# Patient Record
Sex: Female | Born: 1995 | Race: Black or African American | Hispanic: No | Marital: Single | Smoking: Former smoker
Health system: Southern US, Community
[De-identification: ages and names within clinical notes are randomized; demographics above are authoritative.]

## PROBLEM LIST (undated history)

## (undated) DIAGNOSIS — N739 Female pelvic inflammatory disease, unspecified: Secondary | ICD-10-CM

## (undated) DIAGNOSIS — N390 Urinary tract infection, site not specified: Secondary | ICD-10-CM

## (undated) HISTORY — PX: NO PAST SURGERIES: SHX2092

## (undated) HISTORY — DX: Urinary tract infection, site not specified: N39.0

---
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
## 2014-09-04 ENCOUNTER — Encounter (HOSPITAL_COMMUNITY): Payer: Self-pay | Admitting: Emergency Medicine

## 2014-09-04 ENCOUNTER — Emergency Department (INDEPENDENT_AMBULATORY_CARE_PROVIDER_SITE_OTHER)
Admission: EM | Admit: 2014-09-04 | Discharge: 2014-09-04 | Disposition: A | Discharge status: Home / Self Care | Payer: Medicaid Other | Source: Home / Self Care

## 2014-09-04 DIAGNOSIS — J029 Acute pharyngitis, unspecified: Secondary | ICD-10-CM

## 2014-09-04 LAB — POCT RAPID STREP A: Streptococcus, Group A Screen (Direct): NEGATIVE

## 2014-09-04 LAB — POCT INFECTIOUS MONO SCREEN: MONO SCREEN: NEGATIVE

## 2014-09-04 MED ORDER — CLINDAMYCIN HCL 300 MG PO CAPS: 300.0000 mg | ORAL_CAPSULE | Freq: Three times a day (TID) | ORAL | Status: DC

## 2014-09-04 NOTE — ED Provider Notes (Signed)
CSN: [id number]     Arrival date & time 09/04/14  1733 History   First MD Initiated Contact with Patient 09/04/14 1751     Chief Complaint  Patient presents with  . Sore Throat   (Consider location/radiation/quality/duration/timing/severity/associated sxs/prior Treatment) HPI Comments: 18 year old female states that she developed a minor sore throat approximately 10 days ago. 4 days ago the throat pain became worse and developed a fever over 103. She saw a physician and was diagnosed with strep throat, although no testing for strep was performed. She was administered penicillin IM and a prescription for amoxicillin. Since the treatment she continues to have a sore throat and fever about 103 at home. She feels as though she is not getting any better and she continues to sore throat pain. Is also noted that her flu test was negative.   History reviewed. No pertinent past medical history. History reviewed. No pertinent past surgical history. History reviewed. No pertinent family history. History  Substance Use Topics  . Smoking status: Never Smoker   . Smokeless tobacco: Not on file  . Alcohol Use: No   OB History   Grav Para Term Preterm Abortions TAB SAB Ect Mult Living                 Review of Systems  Constitutional: Positive for fever, activity change and fatigue.  HENT: Positive for sore throat. Negative for congestion, ear pain, hearing loss, mouth sores, postnasal drip, rhinorrhea and trouble swallowing.   Respiratory: Negative for cough, choking, shortness of breath, wheezing and stridor.   Cardiovascular: Negative for chest pain.  Gastrointestinal: Negative.   Musculoskeletal: Negative.   Skin: Negative.     Allergies  Review of patient's allergies indicates no known allergies.  Home Medications   Prior to Admission medications   Medication Sig Start Date End Date Taking? Authorizing Provider  amoxicillin (AMOXIL) 500 MG capsule Take 1,000 mg by mouth daily.   Yes  Historical Provider, MD  ibuprofen (ADVIL,MOTRIN) 200 MG tablet Take 800 mg by mouth every 6 (six) hours as needed for fever or moderate pain.    Yes Historical Provider, MD  clindamycin (CLEOCIN) 300 MG capsule Take 1 capsule (300 mg total) by mouth 3 (three) times daily. 09/04/14   Hayden Rasmussenavid Jeryn Bertoni, NP   BP 106/66  Pulse 119  Temp(Src) 100 F (37.8 C) (Oral)  Resp 20  SpO2 99%  LMP 08/15/2014 Physical Exam  Nursing note and vitals reviewed. Constitutional: She is oriented to person, place, and time. She appears well-developed and well-nourished. No distress.  HENT:  Bilateral TMs are normal Oropharynx with enlarged, cryptic erythematous palatine tonsils with exudates. No retropharyngeal abscess is appreciated. Airway is widely patent.  Eyes: Conjunctivae and EOM are normal.  Neck: Normal range of motion. Neck supple.  Multiple, enlarged bilateral anterior cervical chain lymphadenitis.  Cardiovascular: Regular rhythm and normal heart sounds.   Mild tachycardia  Pulmonary/Chest: Effort normal and breath sounds normal. No respiratory distress. She has no wheezes. She has no rales.  Musculoskeletal: She exhibits no edema.  Lymphadenopathy:    She has cervical adenopathy.  Neurological: She is alert and oriented to person, place, and time. She exhibits normal muscle tone.  Skin: Skin is warm and dry. No erythema.  Psychiatric: She has a normal mood and affect.    ED Course  Procedures (including critical care time) Labs Review Labs Reviewed  MONONUCLEOSIS SCREEN  POCT RAPID STREP A ([facility]ARE ONLY)  POCT INFECTIOUS MONO SCREEN  Results for orders placed during the hospital encounter of 09/04/14  POCT RAPID STREP A ([facility] ONLY)      Result Value Ref Range   Streptococcus, Group A Screen (Direct) NEGATIVE  NEGATIVE  POCT INFECTIOUS MONO SCREEN      Result Value Ref Range   Mono Screen NEGATIVE  NEGATIVE    Imaging Review No results found.   MDM   1. Exudative  pharyngitis     Likely viral. Will consider other bacterial organisms and tx with clindamycin Other is possible mono with delayed testing response. Ibuprofen 400 to 600 mg every 6 to 8 hours Lots of fuids See your PCP in 3 days     Hayden Rasmussenavid Brynnan Rodenbaugh, NP 09/04/14 1918

## 2014-09-04 NOTE — ED Notes (Signed)
States she had strep throat last Fri.  It got worse this Fri.  She got a shot of PCN on 10/23.  Got worse last night.  Temp was 103 at 1600.  She took Ibuprofen 800 mg @ 1530.  C/o pain in the back of her neck. States her throat is swollen.

## 2014-09-04 NOTE — ED Provider Notes (Signed)
Medical screening examination/treatment/procedure(s) were performed by resident physician or non-physician practitioner and as supervising physician I was immediately available for consultation/collaboration.   Cienna Dumais DOUGLAS MD.   Leondre Taul D Christ Fullenwider, MD 09/04/14 1934 

## 2014-09-04 NOTE — Discharge Instructions (Signed)
Pharyngitis Ibuprofen 400 to 600 mg every 6 to 8 hours Lots of fuids  Pharyngitis is redness, pain, and swelling (inflammation) of your pharynx.  CAUSES  Pharyngitis is usually caused by infection. Most of the time, these infections are from viruses (viral) and are part of a cold. However, sometimes pharyngitis is caused by bacteria (bacterial). Pharyngitis can also be caused by allergies. Viral pharyngitis may be spread from person to person by coughing, sneezing, and personal items or utensils (cups, forks, spoons, toothbrushes). Bacterial pharyngitis may be spread from person to person by more intimate contact, such as kissing.  SIGNS AND SYMPTOMS  Symptoms of pharyngitis include:   Sore throat.   Tiredness (fatigue).   Low-grade fever.   Headache.  Joint pain and muscle aches.  Skin rashes.  Swollen lymph nodes.  Plaque-like film on throat or tonsils (often seen with bacterial pharyngitis). DIAGNOSIS  Your health care provider will ask you questions about your illness and your symptoms. Your medical history, along with a physical exam, is often all that is needed to diagnose pharyngitis. Sometimes, a rapid strep test is done. Other lab tests may also be done, depending on the suspected cause.  TREATMENT  Viral pharyngitis will usually get better in 3-4 days without the use of medicine. Bacterial pharyngitis is treated with medicines that kill germs (antibiotics).  HOME CARE INSTRUCTIONS   Drink enough water and fluids to keep your urine clear or pale yellow.   Only take over-the-counter or prescription medicines as directed by your health care provider:   If you are prescribed antibiotics, make sure you finish them even if you start to feel better.   Do not take aspirin.   Get lots of rest.   Gargle with 8 oz of salt water ( tsp of salt per 1 qt of water) as often as every 1-2 hours to soothe your throat.   Throat lozenges (if you are not at risk for choking)  or sprays may be used to soothe your throat. SEEK MEDICAL CARE IF:   You have large, tender lumps in your neck.  You have a rash.  You cough up green, yellow-brown, or bloody spit. SEEK IMMEDIATE MEDICAL CARE IF:   Your neck becomes stiff.  You drool or are unable to swallow liquids.  You vomit or are unable to keep medicines or liquids down.  You have severe pain that does not go away with the use of recommended medicines.  You have trouble breathing (not caused by a stuffy nose). MAKE SURE YOU:   Understand these instructions.  Will watch your condition.  Will get help right away if you are not doing well or get worse. Document Released: 10/26/2005 Document Revised: 08/16/2013 Document Reviewed: 07/03/2013 [facility] Patient Information 2015 [place], [place]. This information is not intended to replace advice given to you by your health care provider. Make sure you discuss any questions you have with your health care provider.  Salt Water Gargle This solution will help make your mouth and throat feel better. HOME CARE INSTRUCTIONS   Mix 1 teaspoon of salt in 8 ounces of warm water.  Gargle with this solution as much or often as you need or as directed. Swish and gargle gently if you have any sores or wounds in your mouth.  Do not swallow this mixture. Document Released: 07/30/2004 Document Revised: 01/18/2012 Document Reviewed: 12/21/2008 [facility] Patient Information 2015 [place], [place]. This information is not intended to replace advice given to you by your health care  provider. Make sure you discuss any questions you have with your health care provider. ° °

## 2014-09-06 LAB — CULTURE, GROUP A STREP

## 2015-02-24 ENCOUNTER — Emergency Department (HOSPITAL_COMMUNITY)
Admission: EM | Admit: 2015-02-24 | Discharge: 2015-02-24 | Disposition: A | Discharge status: Home / Self Care | Payer: Medicaid Other | Attending: Emergency Medicine | Admitting: Emergency Medicine

## 2015-02-24 ENCOUNTER — Encounter (HOSPITAL_COMMUNITY): Payer: Self-pay | Admitting: Emergency Medicine

## 2015-02-24 DIAGNOSIS — N739 Female pelvic inflammatory disease, unspecified: Secondary | ICD-10-CM | POA: Diagnosis not present

## 2015-02-24 DIAGNOSIS — Z792 Long term (current) use of antibiotics: Secondary | ICD-10-CM | POA: Insufficient documentation

## 2015-02-24 DIAGNOSIS — N898 Other specified noninflammatory disorders of vagina: Secondary | ICD-10-CM | POA: Diagnosis present

## 2015-02-24 DIAGNOSIS — Z3202 Encounter for pregnancy test, result negative: Secondary | ICD-10-CM | POA: Insufficient documentation

## 2015-02-24 DIAGNOSIS — N730 Acute parametritis and pelvic cellulitis: Secondary | ICD-10-CM

## 2015-02-24 LAB — URINALYSIS, ROUTINE W REFLEX MICROSCOPIC
Glucose, UA: NEGATIVE mg/dL
Hgb urine dipstick: NEGATIVE
Ketones, ur: 15 mg/dL — AB
Nitrite: NEGATIVE
PH: 5.5 (ref 5.0–8.0)
Protein, ur: 30 mg/dL — AB
Specific Gravity, Urine: 1.035 — ABNORMAL HIGH (ref 1.005–1.030)
UROBILINOGEN UA: 1 mg/dL (ref 0.0–1.0)

## 2015-02-24 LAB — WET PREP, GENITAL
TRICH WET PREP: NONE SEEN
Yeast Wet Prep HPF POC: NONE SEEN

## 2015-02-24 LAB — URINE MICROSCOPIC-ADD ON

## 2015-02-24 LAB — COMPREHENSIVE METABOLIC PANEL
ALBUMIN: 4.3 g/dL (ref 3.5–5.2)
ALT: 11 U/L (ref 0–35)
ANION GAP: 11 (ref 5–15)
AST: 19 U/L (ref 0–37)
Alkaline Phosphatase: 76 U/L (ref 39–117)
BILIRUBIN TOTAL: 0.9 mg/dL (ref 0.3–1.2)
BUN: 9 mg/dL (ref 6–23)
CALCIUM: 9.6 mg/dL (ref 8.4–10.5)
CO2: 24 mmol/L (ref 19–32)
Chloride: 103 mmol/L (ref 96–112)
Creatinine, Ser: 0.81 mg/dL (ref 0.50–1.10)
Glucose, Bld: 96 mg/dL (ref 70–99)
POTASSIUM: 3.5 mmol/L (ref 3.5–5.1)
SODIUM: 138 mmol/L (ref 135–145)
Total Protein: 7.4 g/dL (ref 6.0–8.3)

## 2015-02-24 LAB — CBC WITH DIFFERENTIAL/PLATELET
BASOS PCT: 0 % (ref 0–1)
Basophils Absolute: 0 10*3/uL (ref 0.0–0.1)
EOS PCT: 1 % (ref 0–5)
Eosinophils Absolute: 0.1 10*3/uL (ref 0.0–0.7)
HEMATOCRIT: 35.8 % — AB (ref 36.0–46.0)
HEMOGLOBIN: 12.4 g/dL (ref 12.0–15.0)
LYMPHS PCT: 27 % (ref 12–46)
Lymphs Abs: 3.2 10*3/uL (ref 0.7–4.0)
MCH: 27.9 pg (ref 26.0–34.0)
MCHC: 34.6 g/dL (ref 30.0–36.0)
MCV: 80.4 fL (ref 78.0–100.0)
Monocytes Absolute: 0.6 10*3/uL (ref 0.1–1.0)
Monocytes Relative: 5 % (ref 3–12)
NEUTROS ABS: 7.9 10*3/uL — AB (ref 1.7–7.7)
NEUTROS PCT: 67 % (ref 43–77)
PLATELETS: 289 10*3/uL (ref 150–400)
RBC: 4.45 MIL/uL (ref 3.87–5.11)
RDW: 14.5 % (ref 11.5–15.5)
WBC: 11.9 10*3/uL — AB (ref 4.0–10.5)

## 2015-02-24 LAB — POC URINE PREG, ED: Preg Test, Ur: NEGATIVE

## 2015-02-24 MED ORDER — IBUPROFEN 400 MG PO TABS: 400.0000 mg | ORAL_TABLET | Freq: Four times a day (QID) | ORAL | Status: DC | PRN

## 2015-02-24 MED ORDER — MORPHINE SULFATE 4 MG/ML IJ SOLN
4.0000 mg | Freq: Once | INTRAMUSCULAR | Status: DC
Start: 2015-02-24 — End: 2015-02-24
  Filled 2015-02-24: qty 1

## 2015-02-24 MED ORDER — AZITHROMYCIN 250 MG PO TABS
1000.0000 mg | ORAL_TABLET | Freq: Once | ORAL | Status: AC
  Administered 2015-02-24: 1000 mg via ORAL
  Filled 2015-02-24: qty 4

## 2015-02-24 MED ORDER — DEXTROSE 5 % IV SOLN
250.0000 mg | Freq: Once | INTRAVENOUS | Status: AC
  Administered 2015-02-24: 250 mg via INTRAVENOUS
  Filled 2015-02-24: qty 250

## 2015-02-24 MED ORDER — DOXYCYCLINE HYCLATE 100 MG PO CAPS: 100.0000 mg | ORAL_CAPSULE | Freq: Two times a day (BID) | ORAL | Status: DC

## 2015-02-24 NOTE — ED Notes (Signed)
Pt. Left with all belongings and refused wheelchair 

## 2015-02-24 NOTE — ED Notes (Signed)
Pt placed in a gown and hooked up to the monitor with the BP cuff and pulse ox 

## 2015-02-24 NOTE — ED Provider Notes (Signed)
CSN: [id number]     Arrival date & time 02/24/15  0006 History  This chart was scribed for Maria Ware Mikah Rottinghaus, MD by Freida Busmaniana Omoyeni, ED Scribe. This patient was seen in room B16C/B16C and the patient's care was started 3:11 AM.    Chief Complaint  Patient presents with  . Abdominal Pain  . Vaginal Ware    The history is provided by the patient. No language interpreter was used.     HPI Comments:  Maria Ware is a 19 y.o. female who presents to the Emergency Department complaining of  cramping lower abdominal pain for ~5 days. She reports associated back pain and abnormal vaginal Ware that started out reddish in color. She deneis hematuria, dysuria, vomiting, fever and chills. She also denies h/o STDs and unprotected sexual intercourse.  No alleviating factors noted. LNMP was ~ 02/06/15.   History reviewed. No pertinent past medical history. History reviewed. No pertinent past surgical history. No family history on file. History  Substance Use Topics  . Smoking status: Never Smoker   . Smokeless tobacco: Not on file  . Alcohol Use: No   OB History    No data available     Review of Systems  Constitutional: Negative for fever and chills.  Gastrointestinal: Positive for abdominal pain. Negative for vomiting.  Genitourinary: Positive for vaginal Ware. Negative for dysuria and hematuria.      Allergies  Review of patient's allergies indicates no known allergies.  Home Medications   Prior to Admission medications   Medication Sig Start Date End Date Taking? Authorizing Provider  amoxicillin (AMOXIL) 500 MG capsule Take 1,000 mg by mouth daily.    Historical Provider, MD  clindamycin (CLEOCIN) 300 MG capsule Take 1 capsule (300 mg total) by mouth 3 (three) times daily. Patient not taking: Reported on 02/24/2015 09/04/14   Hayden Rasmussenavid Mabe, NP  doxycycline (VIBRAMYCIN) 100 MG capsule Take 1 capsule (100 mg total) by mouth 2 (two) times daily. 02/24/15   Maria Ware  Maleeyah Mccaughey, MD  ibuprofen (ADVIL,MOTRIN) 400 MG tablet Take 1 tablet (400 mg total) by mouth every 6 (six) hours as needed. 02/24/15   Alicia Ackert Rhunette CroftNanavati, MD   BP 96/61 mmHg  Pulse 71  Temp(Src) 97.9 F (36.6 C) (Oral)  Resp 16  SpO2 100%  LMP 02/07/2015 Physical Exam  Constitutional: She is oriented to person, place, and time. She appears well-developed and well-nourished. No distress.  HENT:  Head: Normocephalic and atraumatic.  Eyes: Conjunctivae and EOM are normal. Pupils are equal, round, and reactive to light.  Neck: Normal range of motion. Neck supple.  Cardiovascular: Normal rate, regular rhythm, normal heart sounds and intact distal pulses.   No murmur heard. Pulmonary/Chest: Effort normal. No respiratory distress. She has no wheezes.  Abdominal: Soft. Bowel sounds are normal. She exhibits no distension. There is tenderness (Diffuse lower quardrant tenderness). There is guarding (Voluntary guarding ). There is no rebound.  Genitourinary: Vagina normal and uterus normal.  External exam - normal, no lesions Speculum exam: Pt has some Maria Ware, COPIUS, no blood Bimanual exam: Patient has + CMT, no adnexal tenderness or fullness and cervical os is closed  Musculoskeletal: Normal range of motion.  Neurological: She is alert and oriented to person, place, and time.  Skin: Skin is warm and dry.  Nursing note and vitals reviewed.   ED Course  Procedures   DIAGNOSTIC STUDIES:  Oxygen Saturation is 100% on RA, normal by my interpretation.    COORDINATION OF CARE:  3:16 AM Partial  results given. Will order pain meds. Discussed treatment plan with pt at bedside and pt agreed to plan.  Labs Review Labs Reviewed  WET PREP, GENITAL - Abnormal; Notable for the following:    Clue Cells Wet Prep HPF POC FEW (*)    WBC, Wet Prep HPF POC TOO NUMEROUS TO COUNT (*)    All other components within normal limits  CBC WITH DIFFERENTIAL/PLATELET - Abnormal; Notable for the following:     WBC 11.9 (*)    HCT 35.8 (*)    Neutro Abs 7.9 (*)    All other components within normal limits  URINALYSIS, ROUTINE W REFLEX MICROSCOPIC - Abnormal; Notable for the following:    APPearance TURBID (*)    Specific Gravity, Urine 1.035 (*)    Bilirubin Urine SMALL (*)    Ketones, ur 15 (*)    Protein, ur 30 (*)    Leukocytes, UA LARGE (*)    All other components within normal limits  URINE MICROSCOPIC-ADD ON - Abnormal; Notable for the following:    Squamous Epithelial / LPF MANY (*)    Bacteria, UA MANY (*)    All other components within normal limits  COMPREHENSIVE METABOLIC PANEL  POC URINE PREG, ED  GC/CHLAMYDIA PROBE AMP ([place])    Imaging Review No results found.   EKG Interpretation None      MDM   Final diagnoses:  PID (acute pelvic inflammatory disease)    I personally performed the services described in this documentation, which was scribed in my presence. The recorded information has been reviewed and is accurate.  Pt comes in with cc of abdominal pain. She has vaginal Ware, lower quadrant pain. On exam, she has purulent Ware and CMT. Pt admits to unprotected intercourse now. Will tx for PID. Strict return precautions discussed.    Maria Kaplan, MD 02/24/15 [phone]

## 2015-02-24 NOTE — Discharge Instructions (Signed)
Based on the exam - lower pain, with pus like discharge, i am concerned that you have a severe infection in your pelvis. We treated you for gonorrhea and chlamydia infection while you were in the ER. Rest of the results show: There is no evidence of Urinary tract infection and the pregnancy test is negative as well.  PLEASE PRACTICE SAFE SEX. PLEASE INFORM YOUR PARTNER TO GET CHECKED AND TREATED FOR STD AS WELL.   Pelvic Inflammatory Disease Pelvic inflammatory disease (PID) refers to an infection in some or all of the female organs. The infection can be in the uterus, ovaries, fallopian tubes, or the surrounding tissues in the pelvis. PID can cause abdominal or pelvic pain that comes on suddenly (acute pelvic pain). PID is a serious infection because it can lead to lasting (chronic) pelvic pain or the inability to have children (infertile).  CAUSES  The infection is often caused by the normal bacteria found in the vaginal tissues. PID may also be caused by an infection that is spread during sexual contact. PID can also occur following:   The birth of a baby.   A miscarriage.   An abortion.   Major pelvic surgery.   The use of an intrauterine device (IUD).   A sexual assault.  RISK FACTORS Certain factors can put a person at higher risk for PID, such as:  Being younger than 25 years.  Being sexually active at [place] age.  Usingnonbarrier contraception.  Havingmultiple sexual partners.  Having sex with someone who has symptoms of a genital infection.  Using oral contraception. Other times, certain behaviors can increase the possibility of getting PID, such as:  Having sex during your period.  Using a vaginal douche.  Having an intrauterine device (IUD) in place. SYMPTOMS   Abdominal or pelvic pain.   Fever.   Chills.   Abnormal vaginal discharge.  Abnormal uterine bleeding.   Unusual pain shortly after finishing your period. DIAGNOSIS  Your  caregiver will choose some of the following methods to make a diagnosis, such as:   Performinga physical exam and history. A pelvic exam typically reveals a very tender uterus and surrounding pelvis.   Ordering laboratory tests including a pregnancy test, blood tests, and urine test.  Orderingcultures of the vagina and cervix to check for a sexually transmitted infection (STI).  Performing an ultrasound.   Performing a laparoscopic procedure to look inside the pelvis.  TREATMENT   Antibiotic medicines may be prescribed and taken by mouth.   Sexual partners may be treated when the infection is caused by a sexually transmitted disease (STD).   Hospitalization may be needed to give antibiotics intravenously.  Surgery may be needed, but this is rare. It may take weeks until you are completely well. If you are diagnosed with PID, you should also be checked for human immunodeficiency virus (HIV). HOME CARE INSTRUCTIONS   If given, take your antibiotics as directed. Finish the medicine even if you start to feel better.   Only take over-the-counter or prescription medicines for pain, discomfort, or fever as directed by your caregiver.   Do not have sexual intercourse until treatment is completed or as directed by your caregiver. If PID is confirmed, your recent sexual partner(s) will need treatment.   Keep your follow-up appointments. SEEK MEDICAL CARE IF:   You have increased or abnormal vaginal discharge.   You need prescription medicine for your pain.   You vomit.   You cannot take your medicines.   Your  partner has an STD.  SEEK IMMEDIATE MEDICAL CARE IF:   You have a fever.   You have increased abdominal or pelvic pain.   You have chills.   You have pain when you urinate.   You are not better after 72 hours following treatment.  MAKE SURE YOU:   Understand these instructions.  Will watch your condition.  Will get help right away if you are  not doing well or get worse. Document Released: 10/26/2005 Document Revised: 02/20/2013 Document Reviewed: 10/22/2011 [facility] Patient Information 2015 [place], [place]. This information is not intended to replace advice given to you by your health care provider. Make sure you discuss any questions you have with your health care provider.   Sexually Transmitted Disease A sexually transmitted disease (STD) is a disease or infection that may be passed (transmitted) from person to person, usually during sexual activity. This may happen by way of saliva, semen, blood, vaginal mucus, or urine. Common STDs include:   Gonorrhea.   Chlamydia.   Syphilis.   HIV and AIDS.   Genital herpes.   Hepatitis B and C.   Trichomonas.   Human papillomavirus (HPV).   Pubic lice.   Scabies.  Mites.  Bacterial vaginosis. WHAT ARE CAUSES OF STDs? An STD may be caused by bacteria, a virus, or parasites. STDs are often transmitted during sexual activity if one person is infected. However, they may also be transmitted through nonsexual means. STDs may be transmitted after:   Sexual intercourse with an infected person.   Sharing sex toys with an infected person.   Sharing needles with an infected person or using unclean piercing or tattoo needles.  Having intimate contact with the genitals, mouth, or rectal areas of an infected person.   Exposure to infected fluids during birth. WHAT ARE THE SIGNS AND SYMPTOMS OF STDs? Different STDs have different symptoms. Some people may not have any symptoms. If symptoms are present, they may include:   Painful or bloody urination.   Pain in the pelvis, abdomen, vagina, anus, throat, or eyes.   A skin rash, itching, or irritation.  Growths, ulcerations, blisters, or sores in the genital and anal areas.  Abnormal vaginal discharge with or without bad odor.   Penile discharge in men.   Fever.   Pain or bleeding during sexual  intercourse.   Swollen glands in the groin area.   Yellow skin and eyes (jaundice). This is seen with hepatitis.   Swollen testicles.  Infertility.  Sores and blisters in the mouth. HOW ARE STDs DIAGNOSED? To make a diagnosis, your health care provider may:   Take a medical history.   Perform a physical exam.   Take a sample of any discharge to examine.  Swab the throat, cervix, opening to the penis, rectum, or vagina for testing.  Test a sample of your first morning urine.   Perform blood tests.   Perform a Pap test, if this applies.   Perform a colposcopy.   Perform a laparoscopy.  HOW ARE STDs TREATED? Treatment depends on the STD. Some STDs may be treated but not cured.   Chlamydia, gonorrhea, trichomonas, and syphilis can be cured with antibiotic medicine.   Genital herpes, hepatitis, and HIV can be treated, but not cured, with prescribed medicines. The medicines lessen symptoms.   Genital warts from HPV can be treated with medicine or by freezing, burning (electrocautery), or surgery. Warts may come back.   HPV cannot be cured with medicine or surgery. However, abnormal  areas may be removed from the cervix, vagina, or vulva.   If your diagnosis is confirmed, your recent sexual partners need treatment. This is true even if they are symptom-free or have a negative culture or evaluation. They should not have sex until their health care providers say it is okay. HOW CAN I REDUCE MY RISK OF GETTING AN STD? Take these steps to reduce your risk of getting an STD:  Use latex condoms, dental dams, and water-soluble lubricants during sexual activity. Do not use petroleum jelly or oils.  Avoid having multiple sex partners.  Do not have sex with someone who has other sex partners.  Do not have sex with anyone you do not know or who is at high risk for an STD.  Avoid risky sex practices that can break your skin.  Do not have sex if you have open sores  on your mouth or skin.  Avoid drinking too much alcohol or taking illegal drugs. Alcohol and drugs can affect your judgment and put you in a vulnerable position.  Avoid engaging in oral and anal sex acts.  Get vaccinated for HPV and hepatitis. If you have not received these vaccines in the past, talk to your health care provider about whether one or both might be right for you.   If you are at risk of being infected with HIV, it is recommended that you take a prescription medicine daily to prevent HIV infection. This is called pre-exposure prophylaxis (PrEP). You are considered at risk if:  You are a man who has sex with other men (MSM).  You are a heterosexual man or woman and are sexually active with more than one partner.  You take drugs by injection.  You are sexually active with a partner who has HIV.  Talk with your health care provider about whether you are at high risk of being infected with HIV. If you choose to begin PrEP, you should first be tested for HIV. You should then be tested every 3 months for as long as you are taking PrEP.  WHAT SHOULD I DO IF I THINK I HAVE AN STD?  See your health care provider.   Tell your sexual partner(s). They should be tested and treated for any STDs.  Do not have sex until your health care provider says it is okay. WHEN SHOULD I GET IMMEDIATE MEDICAL CARE? Contact your health care provider right away if:   You have severe abdominal pain.  You are a man and notice swelling or pain in your testicles.  You are a woman and notice swelling or pain in your vagina. Document Released: 01/16/2003 Document Revised: 10/31/2013 Document Reviewed: 05/16/2013 [facility] Patient Information 2015 [place], [place]. This information is not intended to replace advice given to you by your health care provider. Make sure you discuss any questions you have with your health care provider.

## 2015-02-24 NOTE — ED Notes (Signed)
Pt. reports low abdominal cramping with vaginal discharge / malodorous discharge and spotting onset last Wednesday . Denies dysuria / no fever or chills.

## 2015-02-25 LAB — GC/CHLAMYDIA PROBE AMP (~~LOC~~) NOT AT ARMC
CHLAMYDIA, DNA PROBE: POSITIVE — AB
NEISSERIA GONORRHEA: NEGATIVE

## 2015-02-26 ENCOUNTER — Telehealth (HOSPITAL_COMMUNITY): Payer: Self-pay

## 2015-02-26 NOTE — ED Notes (Signed)
Positive for chlamydia- treated per protocol. DHHS form faxed. Attempted to contact

## 2015-03-13 ENCOUNTER — Emergency Department (HOSPITAL_COMMUNITY)
Admission: EM | Admit: 2015-03-13 | Discharge: 2015-03-13 | Disposition: A | Discharge status: Home / Self Care | Payer: Medicaid Other | Attending: Emergency Medicine | Admitting: Emergency Medicine

## 2015-03-13 ENCOUNTER — Encounter (HOSPITAL_COMMUNITY): Payer: Self-pay | Admitting: Emergency Medicine

## 2015-03-13 DIAGNOSIS — J020 Streptococcal pharyngitis: Secondary | ICD-10-CM | POA: Diagnosis not present

## 2015-03-13 DIAGNOSIS — E860 Dehydration: Secondary | ICD-10-CM | POA: Insufficient documentation

## 2015-03-13 DIAGNOSIS — Z792 Long term (current) use of antibiotics: Secondary | ICD-10-CM | POA: Diagnosis not present

## 2015-03-13 DIAGNOSIS — R51 Headache: Secondary | ICD-10-CM | POA: Diagnosis present

## 2015-03-13 LAB — RAPID STREP SCREEN (MED CTR MEBANE ONLY): STREPTOCOCCUS, GROUP A SCREEN (DIRECT): POSITIVE — AB

## 2015-03-13 LAB — POC URINE PREG, ED: PREG TEST UR: NEGATIVE

## 2015-03-13 MED ORDER — ACETAMINOPHEN 500 MG PO TABS
1000.0000 mg | ORAL_TABLET | Freq: Once | ORAL | Status: AC
  Administered 2015-03-13: 1000 mg via ORAL
  Filled 2015-03-13: qty 2

## 2015-03-13 MED ORDER — KETOROLAC TROMETHAMINE 30 MG/ML IJ SOLN
30.0000 mg | Freq: Once | INTRAMUSCULAR | Status: AC
  Administered 2015-03-13: 30 mg via INTRAVENOUS
  Filled 2015-03-13: qty 1

## 2015-03-13 MED ORDER — PENICILLIN G BENZATHINE 1200000 UNIT/2ML IM SUSP
1.2000 10*6.[IU] | Freq: Once | INTRAMUSCULAR | Status: AC
  Administered 2015-03-13: 1.2 10*6.[IU] via INTRAMUSCULAR
  Filled 2015-03-13: qty 2

## 2015-03-13 MED ORDER — SODIUM CHLORIDE 0.9 % IV BOLUS (SEPSIS)
1000.0000 mL | Freq: Once | INTRAVENOUS | Status: AC
  Administered 2015-03-13: 1000 mL via INTRAVENOUS

## 2015-03-13 MED ORDER — METHYLPREDNISOLONE SODIUM SUCC 125 MG IJ SOLR
125.0000 mg | Freq: Once | INTRAMUSCULAR | Status: AC
  Administered 2015-03-13: 125 mg via INTRAVENOUS
  Filled 2015-03-13: qty 2

## 2015-03-13 NOTE — ED Provider Notes (Signed)
CSN: [id number]     Arrival date & time 03/13/15  0848 History   First MD Initiated Contact with Patient 03/13/15 [phone]     Chief Complaint  Patient presents with  . flu like symptoms      (Consider location/radiation/quality/duration/timing/severity/associated sxs/prior Treatment) HPI  Maria Ware is a 19 y.o. female  presenting with 2 day history of headache that developed gradually that like other headaches as well as generalized body aches and sore throat with fever chills. Patient states her symptoms are better with rest and ibuprofen. Patient states her pain is worse with cold fluids. No cough. She denies any nausea, vomiting, abdominal pain. Patient denies sick contacts.   History reviewed. No pertinent past medical history. History reviewed. No pertinent past surgical history. No family history on file. History  Substance Use Topics  . Smoking status: Never Smoker   . Smokeless tobacco: Not on file  . Alcohol Use: No   OB History    No data available     Review of Systems  Constitutional: Positive for fever and chills.  Respiratory: Negative for cough and shortness of breath.   Gastrointestinal: Negative for nausea and vomiting.  Neurological: Positive for headaches. Negative for light-headedness.      Allergies  Review of patient's allergies indicates no known allergies.  Home Medications   Prior to Admission medications   Medication Sig Start Date End Date Taking? Authorizing Provider  amoxicillin (AMOXIL) 500 MG capsule Take 1,000 mg by mouth daily.    Historical Provider, MD  clindamycin (CLEOCIN) 300 MG capsule Take 1 capsule (300 mg total) by mouth 3 (three) times daily. Patient not taking: Reported on 02/24/2015 09/04/14   Hayden Rasmussenavid Mabe, NP  doxycycline (VIBRAMYCIN) 100 MG capsule Take 1 capsule (100 mg total) by mouth 2 (two) times daily. 02/24/15   [name] Nanavati, MD  ibuprofen (ADVIL,MOTRIN) 400 MG tablet Take 1 tablet (400 mg total) by mouth every 6  (six) hours as needed. 02/24/15   Ankit Nanavati, MD   BP 102/56 mmHg  Pulse 101  Temp(Src) 99.2 F (37.3 C) (Oral)  Resp 14  SpO2 99%  LMP 02/07/2015 Physical Exam  Constitutional: She appears well-developed and well-nourished. No distress.  HENT:  Head: Normocephalic and atraumatic.  Nose: Right sinus exhibits no maxillary sinus tenderness and no frontal sinus tenderness. Left sinus exhibits no maxillary sinus tenderness and no frontal sinus tenderness.  Mouth/Throat: Mucous membranes are normal. Oropharyngeal exudate, posterior oropharyngeal edema and posterior oropharyngeal erythema present.  No trismus or uvula deviation.  Eyes: Conjunctivae and EOM are normal. Right eye exhibits no discharge. Left eye exhibits no discharge.  Neck: Normal range of motion. Neck supple.  Cardiovascular: Normal rate, regular rhythm and normal heart sounds.   Pulmonary/Chest: Effort normal and breath sounds normal. No respiratory distress. She has no wheezes. She has no rales.  Abdominal: Soft. Bowel sounds are normal. She exhibits no distension. There is no tenderness.  Lymphadenopathy:    She has cervical adenopathy.  Neurological: She is alert.  Skin: Skin is warm and dry. She is not diaphoretic.  Nursing note and vitals reviewed.   ED Course  Procedures (including critical care time) Labs Review Labs Reviewed  RAPID STREP SCREEN - Abnormal; Notable for the following:    Streptococcus, Group A Screen (Direct) POSITIVE (*)    All other components within normal limits  POC URINE PREG, ED    Imaging Review No results found.   EKG Interpretation None  Meds given in ED:  Medications  ketorolac (TORADOL) 30 MG/ML injection 30 mg (not administered)  methylPREDNISolone sodium succinate (SOLU-MEDROL) 125 mg/2 mL injection 125 mg (not administered)  acetaminophen (TYLENOL) tablet 1,000 mg (1,000 mg Oral Given 03/13/15 0908)  sodium chloride 0.9 % bolus 1,000 mL (0 mLs Intravenous Stopped  03/13/15 1112)  penicillin g benzathine (BICILLIN LA) 1200000 UNIT/2ML injection 1.2 Million Units (1.2 Million Units Intramuscular Given 03/13/15 1017)    New Prescriptions   No medications on file      MDM   Final diagnoses:  Streptococcal pharyngitis   Pt febrile with tonsillar exudate, cervical lymphadenopathy, & dysphagia; diagnosis of strep. Treated in the Ed with steroids, NSAIDs, Pain medication and PCN IM.  Pt appears mildly dehydrated, discussed importance of water rehydration. Presentation non concerning for PTA or infxn spread to soft tissue. No trismus or uvula deviation. Specific return precautions discussed. Pt able to drink water in ED without difficulty with intact air way. Recommended PCP follow up.   Discussed return precautions with patient. Discussed all results and patient verbalizes understanding and agrees with plan.  I personally performed the services described in this documentation, which was scribed in my presence. The recorded information has been reviewed and is accurate.  Filed Vitals:   03/13/15 0855 03/13/15 1015  BP: 131/73 102/56  Pulse: 110 101  Temp: 100 F (37.8 C) 99.2 F (37.3 C)  TempSrc: Oral Oral  Resp: 15 14  SpO2: 100% 99%     Oswaldo ConroyVictoria Paxten Appelt, PA-C 03/13/15 1125  Rolland PorterMark James, MD 03/17/15 [phone]

## 2015-03-13 NOTE — ED Notes (Signed)
Pt aware of need of urine specimen

## 2015-03-13 NOTE — ED Notes (Signed)
Patient states started feeling poorly today with headache, body aches, and "little bit of a sore throat".   Patient states that she had chills last night.  Denies N/V.

## 2015-03-13 NOTE — Discharge Instructions (Signed)
Return to the emergency room with worsening of symptoms, new symptoms or with symptoms that are concerning , especially fevers non controlled with tylenol or ibuprofen, stiff neck, worsening headache, nausea/vomiting, visual changes or slurred speech, chest pain, shortness of breath, cough with thick colored mucous or blood Drink plenty of fluids with electrolytes especially Gatorade. OTC cold medications such as mucinex, nyquil, dayquil are recommended. Chloraseptic for sore throat. Call to make appointment for follow up with PCP. Read below information and follow recommendations. Strep Throat Strep throat is an infection of the throat caused by a bacteria named Streptococcus pyogenes. Your health care provider may call the infection streptococcal "tonsillitis" or "pharyngitis" depending on whether there are signs of inflammation in the tonsils or back of the throat. Strep throat is most common in children aged 5-15 years during the cold months of the year, but it can occur in people of any age during any season. This infection is spread from person to person (contagious) through coughing, sneezing, or other close contact. SIGNS AND SYMPTOMS   Fever or chills.  Painful, swollen, red tonsils or throat.  Pain or difficulty when swallowing.  White or yellow spots on the tonsils or throat.  Swollen, tender lymph nodes or "glands" of the neck or under the jaw.  Red rash all over the body (rare). DIAGNOSIS  Many different infections can cause the same symptoms. A test must be done to confirm the diagnosis so the right treatment can be given. A "rapid strep test" can help your health care provider make the diagnosis in a few minutes. If this test is not available, a light swab of the infected area can be used for a throat culture test. If a throat culture test is done, results are usually available in a day or two. TREATMENT  Strep throat is treated with antibiotic medicine. HOME CARE INSTRUCTIONS     Gargle with 1 tsp of salt in 1 cup of warm water, 3-4 times per day or as needed for comfort.  Family members who also have a sore throat or fever should be tested for strep throat and treated with antibiotics if they have the strep infection.  Make sure everyone in your household washes their hands well.  Do not share food, drinking cups, or personal items that could cause the infection to spread to others.  You may need to eat a soft food diet until your sore throat gets better.  Drink enough water and fluids to keep your urine clear or pale yellow. This will help prevent dehydration.  Get plenty of rest.  Stay home from school, day care, or work until you have been on antibiotics for 24 hours.  Take medicines only as directed by your health care provider.  Take your antibiotic medicine as directed by your health care provider. Finish it even if you start to feel better. SEEK MEDICAL CARE IF:   The glands in your neck continue to enlarge.  You develop a rash, cough, or earache.  You cough up green, yellow-brown, or bloody sputum.  You have pain or discomfort not controlled by medicines.  Your problems seem to be getting worse rather than better.  You have a fever. SEEK IMMEDIATE MEDICAL CARE IF:   You develop any new symptoms such as vomiting, severe headache, stiff or painful neck, chest pain, shortness of breath, or trouble swallowing.  You develop severe throat pain, drooling, or changes in your voice.  You develop swelling of the neck, or the skin  on the neck becomes red and tender.  You develop signs of dehydration, such as fatigue, dry mouth, and decreased urination.  You become increasingly sleepy, or you cannot wake up completely. MAKE SURE YOU:  Understand these instructions.  Will watch your condition.  Will get help right away if you are not doing well or get worse. Document Released: 10/23/2000 Document Revised: 03/12/2014 Document Reviewed:  12/25/2010 [facility] Patient Information 2015 [place], [place]. This information is not intended to replace advice given to you by your health care provider. Make sure you discuss any questions you have with your health care provider.

## 2015-03-27 ENCOUNTER — Emergency Department (HOSPITAL_COMMUNITY)
Admission: EM | Admit: 2015-03-27 | Discharge: 2015-03-27 | Disposition: A | Discharge status: Home / Self Care | Payer: Medicaid Other | Attending: Emergency Medicine | Admitting: Emergency Medicine

## 2015-03-27 ENCOUNTER — Encounter (HOSPITAL_COMMUNITY): Payer: Self-pay | Admitting: Emergency Medicine

## 2015-03-27 DIAGNOSIS — B379 Candidiasis, unspecified: Secondary | ICD-10-CM | POA: Insufficient documentation

## 2015-03-27 DIAGNOSIS — Z3202 Encounter for pregnancy test, result negative: Secondary | ICD-10-CM | POA: Insufficient documentation

## 2015-03-27 DIAGNOSIS — Z792 Long term (current) use of antibiotics: Secondary | ICD-10-CM | POA: Insufficient documentation

## 2015-03-27 DIAGNOSIS — R1030 Lower abdominal pain, unspecified: Secondary | ICD-10-CM | POA: Diagnosis not present

## 2015-03-27 DIAGNOSIS — N898 Other specified noninflammatory disorders of vagina: Secondary | ICD-10-CM | POA: Insufficient documentation

## 2015-03-27 DIAGNOSIS — L299 Pruritus, unspecified: Secondary | ICD-10-CM | POA: Diagnosis present

## 2015-03-27 LAB — WET PREP, GENITAL
Clue Cells Wet Prep HPF POC: NONE SEEN
Trich, Wet Prep: NONE SEEN

## 2015-03-27 LAB — URINE MICROSCOPIC-ADD ON

## 2015-03-27 LAB — HIV ANTIBODY (ROUTINE TESTING W REFLEX): HIV SCREEN 4TH GENERATION: NONREACTIVE

## 2015-03-27 LAB — URINALYSIS, ROUTINE W REFLEX MICROSCOPIC
BILIRUBIN URINE: NEGATIVE
GLUCOSE, UA: NEGATIVE mg/dL
HGB URINE DIPSTICK: NEGATIVE
KETONES UR: NEGATIVE mg/dL
Nitrite: NEGATIVE
PH: 6 (ref 5.0–8.0)
PROTEIN: NEGATIVE mg/dL
SPECIFIC GRAVITY, URINE: 1.025 (ref 1.005–1.030)
UROBILINOGEN UA: 0.2 mg/dL (ref 0.0–1.0)

## 2015-03-27 LAB — POC URINE PREG, ED: Preg Test, Ur: NEGATIVE

## 2015-03-27 MED ORDER — CEFTRIAXONE SODIUM 250 MG IJ SOLR
250.0000 mg | Freq: Once | INTRAMUSCULAR | Status: AC
  Administered 2015-03-27: 250 mg via INTRAMUSCULAR
  Filled 2015-03-27: qty 250

## 2015-03-27 MED ORDER — FLUCONAZOLE 150 MG PO TABS: 150.0000 mg | ORAL_TABLET | Freq: Once | ORAL | Status: DC

## 2015-03-27 MED ORDER — DOXYCYCLINE HYCLATE 100 MG PO CAPS: 100.0000 mg | ORAL_CAPSULE | Freq: Two times a day (BID) | ORAL | Status: DC

## 2015-03-27 MED ORDER — LIDOCAINE HCL (PF) 1 % IJ SOLN
INTRAMUSCULAR | Status: AC
  Administered 2015-03-27: 5 mL
  Filled 2015-03-27: qty 5

## 2015-03-27 MED ORDER — FLUCONAZOLE 100 MG PO TABS
150.0000 mg | ORAL_TABLET | Freq: Once | ORAL | Status: AC
  Administered 2015-03-27: 150 mg via ORAL
  Filled 2015-03-27: qty 2

## 2015-03-27 MED ORDER — AZITHROMYCIN 250 MG PO TABS
1000.0000 mg | ORAL_TABLET | Freq: Once | ORAL | Status: AC
  Administered 2015-03-27: 1000 mg via ORAL
  Filled 2015-03-27: qty 4

## 2015-03-27 MED ORDER — HYDROCODONE-ACETAMINOPHEN 5-325 MG PO TABS: 1.0000 | ORAL_TABLET | Freq: Four times a day (QID) | ORAL | Status: DC | PRN

## 2015-03-27 NOTE — ED Provider Notes (Signed)
CSN: [id number]     Arrival date & time 03/27/15  [id number] History   First MD Initiated Contact with Patient 03/27/15 [phone]     Chief Complaint  Patient presents with  . Vaginal Itching     (Consider location/radiation/quality/duration/timing/severity/associated sxs/prior Treatment) HPI Comments: Patient presents to the ED with a chief complaint of vaginal itching, pain, and discharge.  She states that the vagina feels raw and this is the source of the pain.  She states that she has had discharge for the past 3-4 days.  States that she was treated for gonorrhea, but didn't finish her abx.  She reports associated suprapubic pain.  She denies, fevers, chills, nausea, vomiting, diarrhea, dysuria, or hematuria.  She has tried OTC cream with no relief.  Believes that the OTC meds made her symptoms worse.   The history is provided by the patient. No language interpreter was used.    History reviewed. No pertinent past medical history. History reviewed. No pertinent past surgical history. No family history on file. History  Substance Use Topics  . Smoking status: Never Smoker   . Smokeless tobacco: Not on file  . Alcohol Use: No   OB History    No data available     Review of Systems  Constitutional: Negative for fever and chills.  Respiratory: Negative for shortness of breath.   Cardiovascular: Negative for chest pain.  Gastrointestinal: Negative for nausea, vomiting, diarrhea and constipation.  Genitourinary: Positive for vaginal discharge and vaginal pain. Negative for dysuria.  All other systems reviewed and are negative.     Allergies  Review of patient's allergies indicates no known allergies.  Home Medications   Prior to Admission medications   Medication Sig Start Date End Date Taking? Authorizing Provider  amoxicillin (AMOXIL) 500 MG capsule Take 1,000 mg by mouth daily.    Historical Provider, MD  clindamycin (CLEOCIN) 300 MG capsule Take 1 capsule (300 mg total) by mouth  3 (three) times daily. Patient not taking: Reported on 02/24/2015 09/04/14   Hayden Rasmussenavid Mabe, NP  doxycycline (VIBRAMYCIN) 100 MG capsule Take 1 capsule (100 mg total) by mouth 2 (two) times daily. 02/24/15   [name] Nanavati, MD  ibuprofen (ADVIL,MOTRIN) 400 MG tablet Take 1 tablet (400 mg total) by mouth every 6 (six) hours as needed. 02/24/15   Ankit Nanavati, MD   BP 107/66 mmHg  Pulse 91  Temp(Src) 98.1 F (36.7 C) (Oral)  Resp 16  Ht 5\' 4"  (1.626 m)  Wt 128 lb (58.06 kg)  BMI 21.96 kg/m2  SpO2 98%  LMP 03/09/2015 Physical Exam  Constitutional: She is oriented to person, place, and time. She appears well-developed and well-nourished.  HENT:  Head: Normocephalic and atraumatic.  Eyes: Conjunctivae and EOM are normal. Pupils are equal, round, and reactive to light.  Neck: Normal range of motion. Neck supple.  Cardiovascular: Normal rate and regular rhythm.  Exam reveals no gallop and no friction rub.   No murmur heard. Pulmonary/Chest: Effort normal and breath sounds normal. No respiratory distress. She has no wheezes. She has no rales. She exhibits no tenderness.  Abdominal: Soft. Bowel sounds are normal. She exhibits no distension and no mass. There is no tenderness. There is no rebound and no guarding.  Moderate suprapubic ttp, no other focal abdominal tenderness  Genitourinary:  Pelvic exam chaperoned by female ER tech, no right or left adnexal tenderness, no uterine tenderness, copious vaginal discharge, no bleeding, no CMT or friability, no foreign body, no injury to the  external genitalia, mild hypertrophy of external genitalia, no other significant findings   Musculoskeletal: Normal range of motion. She exhibits no edema or tenderness.  Neurological: She is alert and oriented to person, place, and time.  Skin: Skin is warm and dry.  Psychiatric: She has a normal mood and affect. Her behavior is normal. Judgment and thought content normal.  Nursing note and vitals reviewed.   ED  Course  Procedures (including critical care time) Results for orders placed or performed during the hospital encounter of 03/27/15  Wet prep, genital  Result Value Ref Range   Yeast Wet Prep HPF POC FEW (A) NONE SEEN   Trich, Wet Prep NONE SEEN NONE SEEN   Clue Cells Wet Prep HPF POC NONE SEEN NONE SEEN   WBC, Wet Prep HPF POC FEW (A) NONE SEEN  Urinalysis, Routine w reflex microscopic  Result Value Ref Range   Color, Urine YELLOW YELLOW   APPearance TURBID (A) CLEAR   Specific Gravity, Urine 1.025 1.005 - 1.030   pH 6.0 5.0 - 8.0   Glucose, UA NEGATIVE NEGATIVE mg/dL   Hgb urine dipstick NEGATIVE NEGATIVE   Bilirubin Urine NEGATIVE NEGATIVE   Ketones, ur NEGATIVE NEGATIVE mg/dL   Protein, ur NEGATIVE NEGATIVE mg/dL   Urobilinogen, UA 0.2 0.0 - 1.0 mg/dL   Nitrite NEGATIVE NEGATIVE   Leukocytes, UA LARGE (A) NEGATIVE  Urine microscopic-add on  Result Value Ref Range   Squamous Epithelial / LPF MANY (A) RARE   WBC, UA 21-50 <3 WBC/hpf   Bacteria, UA MANY (A) RARE   Urine-Other MANY YEAST   P[facility] urine preg, [facility])  Result Value Ref Range   Preg Test, Ur NEGATIVE NEGATIVE   No results found.    EKG Interpretation None      MDM   Final diagnoses:  Vaginal discharge  Yeast infection    Patient with vaginal discharge, itching, and pain.    Pelvic exam remarkable for copious white discharge.  Some is likely OTC cream.    Wet prep remarkable for yeast. Will treat with Diflucan. Also give prescription for doxycycline, and an additional prescription for Diflucan to be taken after she finishes her antibiotics.    Roxy HorsemanRobert Meric Joye, PA-C 03/27/15 1028  Blake DivineJohn Wofford, MD 03/28/15 [phone]

## 2015-03-27 NOTE — Discharge Instructions (Signed)
Candida Infection A Candida infection (also called yeast, fungus, and Monilia infection) is an overgrowth of yeast that can occur anywhere on the body. A yeast infection commonly occurs in warm, moist body areas. Usually, the infection remains localized but can spread to become a systemic infection. A yeast infection may be a sign of a more severe disease such as diabetes, leukemia, or AIDS. A yeast infection can occur in both men and women. In women, Candida vaginitis is a vaginal infection. It is one of the most common causes of vaginitis. Men usually do not have symptoms or know they have an infection until other problems develop. Men may find out they have a yeast infection because their sex partner has a yeast infection. Uncircumcised men are more likely to get a yeast infection than circumcised men. This is because the uncircumcised glans is not exposed to air and does not remain as dry as that of a circumcised glans. Older adults may develop yeast infections around dentures. CAUSES  Women  Antibiotics.  Steroid medication taken for a long time.  Being overweight (obese).  Diabetes.  Poor immune condition.  Certain serious medical conditions.  Immune suppressive medications for organ transplant patients.  Chemotherapy.  Pregnancy.  Menstruation.  Stress and fatigue.  Intravenous drug use.  Oral contraceptives.  Wearing tight-fitting clothes in the crotch area.  Catching it from a sex partner who has a yeast infection.  Spermicide.  Intravenous, urinary, or other catheters. Men  Catching it from a sex partner who has a yeast infection.  Having oral or anal sex with a person who has the infection.  Spermicide.  Diabetes.  Antibiotics.  Poor immune system.  Medications that suppress the immune system.  Intravenous drug use.  Intravenous, urinary, or other catheters. SYMPTOMS  Women  Thick, white vaginal discharge.  Vaginal itching.  Redness and  swelling in and around the vagina.  Irritation of the lips of the vagina and perineum.  Blisters on the vaginal lips and perineum.  Painful sexual intercourse.  Low blood sugar (hypoglycemia).  Painful urination.  Bladder infections.  Intestinal problems such as constipation, indigestion, bad breath, bloating, increase in gas, diarrhea, or loose stools. Men  Men may develop intestinal problems such as constipation, indigestion, bad breath, bloating, increase in gas, diarrhea, or loose stools.  Dry, cracked skin on the penis with itching or discomfort.  Jock itch.  Dry, flaky skin.  Athlete's foot.  Hypoglycemia. DIAGNOSIS  Women  A history and an exam are performed.  The discharge may be examined under a microscope.  A culture may be taken of the discharge. Men  A history and an exam are performed.  Any discharge from the penis or areas of cracked skin will be looked at under the microscope and cultured.  Stool samples may be cultured. TREATMENT  Women  Vaginal antifungal suppositories and creams.  Medicated creams to decrease irritation and itching on the outside of the vagina.  Warm compresses to the perineal area to decrease swelling and discomfort.  Oral antifungal medications.  Medicated vaginal suppositories or cream for repeated or recurrent infections.  Wash and dry the irritation areas before applying the cream.  Eating yogurt with Lactobacillus may help with prevention and treatment.  Sometimes painting the vagina with gentian violet solution may help if creams and suppositories do not work. Men  Antifungal creams and oral antifungal medications.  Sometimes treatment must continue for 30 days after the symptoms go away to prevent recurrence. HOME CARE INSTRUCTIONS  Women  Use cotton underwear and avoid tight-fitting clothing.  Avoid colored, scented toilet paper and deodorant tampons or pads.  Do not douche.  Keep your diabetes  under control.  Finish all the prescribed medications.  Keep your skin clean and dry.  Consume milk or yogurt with Lactobacillus-active culture regularly. If you get frequent yeast infections and think that is what the infection is, there are over-the-counter medications that you can get. If the infection does not show healing in 3 days, talk to your caregiver.  Tell your sex partner you have a yeast infection. Your partner may need treatment also, especially if your infection does not clear up or recurs. Men  Keep your skin clean and dry.  Keep your diabetes under control.  Finish all prescribed medications.  Tell your sex partner that you have a yeast infection so he or she can be treated if necessary. SEEK MEDICAL CARE IF:   Your symptoms do not clear up or worsen in one week after treatment.  You have an oral temperature above 102 F (38.9 C).  You have trouble swallowing or eating for a prolonged time.  You develop blisters on and around your vagina.  You develop vaginal bleeding and it is not your menstrual period.  You develop abdominal pain.  You develop intestinal problems as mentioned above.  You get weak or light-headed.  You have painful or increased urination.  You have pain during sexual intercourse. MAKE SURE YOU:   Understand these instructions.  Will watch your condition.  Will get help right away if you are not doing well or get worse. Document Released: 12/03/2004 Document Revised: 03/12/2014 Document Reviewed: 03/17/2010 [facility] Patient Information 2015 [place], [place]. This information is not intended to replace advice given to you by your health care provider. Make sure you discuss any questions you have with your health care provider. Sexually Transmitted Disease A sexually transmitted disease (STD) is a disease or infection that may be passed (transmitted) from person to person, usually during sexual activity. This may happen by way of  saliva, semen, blood, vaginal mucus, or urine. Common STDs include:   Gonorrhea.   Chlamydia.   Syphilis.   HIV and AIDS.   Genital herpes.   Hepatitis B and C.   Trichomonas.   Human papillomavirus (HPV).   Pubic lice.   Scabies.  Mites.  Bacterial vaginosis. WHAT ARE CAUSES OF STDs? An STD may be caused by bacteria, a virus, or parasites. STDs are often transmitted during sexual activity if one person is infected. However, they may also be transmitted through nonsexual means. STDs may be transmitted after:   Sexual intercourse with an infected person.   Sharing sex toys with an infected person.   Sharing needles with an infected person or using unclean piercing or tattoo needles.  Having intimate contact with the genitals, mouth, or rectal areas of an infected person.   Exposure to infected fluids during birth. WHAT ARE THE SIGNS AND SYMPTOMS OF STDs? Different STDs have different symptoms. Some people may not have any symptoms. If symptoms are present, they may include:   Painful or bloody urination.   Pain in the pelvis, abdomen, vagina, anus, throat, or eyes.   A skin rash, itching, or irritation.  Growths, ulcerations, blisters, or sores in the genital and anal areas.  Abnormal vaginal discharge with or without bad odor.   Penile discharge in men.   Fever.   Pain or bleeding during sexual intercourse.   Swollen glands  in the groin area.   Yellow skin and eyes (jaundice). This is seen with hepatitis.   Swollen testicles.  Infertility.  Sores and blisters in the mouth. HOW ARE STDs DIAGNOSED? To make a diagnosis, your health care provider may:   Take a medical history.   Perform a physical exam.   Take a sample of any discharge to examine.  Swab the throat, cervix, opening to the penis, rectum, or vagina for testing.  Test a sample of your first morning urine.   Perform blood tests.   Perform a Pap test, if  this applies.   Perform a colposcopy.   Perform a laparoscopy.  HOW ARE STDs TREATED? Treatment depends on the STD. Some STDs may be treated but not cured.   Chlamydia, gonorrhea, trichomonas, and syphilis can be cured with antibiotic medicine.   Genital herpes, hepatitis, and HIV can be treated, but not cured, with prescribed medicines. The medicines lessen symptoms.   Genital warts from HPV can be treated with medicine or by freezing, burning (electrocautery), or surgery. Warts may come back.   HPV cannot be cured with medicine or surgery. However, abnormal areas may be removed from the cervix, vagina, or vulva.   If your diagnosis is confirmed, your recent sexual partners need treatment. This is true even if they are symptom-free or have a negative culture or evaluation. They should not have sex until their health care providers say it is okay. HOW CAN I REDUCE MY RISK OF GETTING AN STD? Take these steps to reduce your risk of getting an STD:  Use latex condoms, dental dams, and water-soluble lubricants during sexual activity. Do not use petroleum jelly or oils.  Avoid having multiple sex partners.  Do not have sex with someone who has other sex partners.  Do not have sex with anyone you do not know or who is at high risk for an STD.  Avoid risky sex practices that can break your skin.  Do not have sex if you have open sores on your mouth or skin.  Avoid drinking too much alcohol or taking illegal drugs. Alcohol and drugs can affect your judgment and put you in a vulnerable position.  Avoid engaging in oral and anal sex acts.  Get vaccinated for HPV and hepatitis. If you have not received these vaccines in the past, talk to your health care provider about whether one or both might be right for you.   If you are at risk of being infected with HIV, it is recommended that you take a prescription medicine daily to prevent HIV infection. This is called pre-exposure  prophylaxis (PrEP). You are considered at risk if:  You are a man who has sex with other men (MSM).  You are a heterosexual man or woman and are sexually active with more than one partner.  You take drugs by injection.  You are sexually active with a partner who has HIV.  Talk with your health care provider about whether you are at high risk of being infected with HIV. If you choose to begin PrEP, you should first be tested for HIV. You should then be tested every 3 months for as long as you are taking PrEP.  WHAT SHOULD I DO IF I THINK I HAVE AN STD?  See your health care provider.   Tell your sexual partner(s). They should be tested and treated for any STDs.  Do not have sex until your health care provider says it is okay. WHEN SHOULD  I GET IMMEDIATE MEDICAL CARE? Contact your health care provider right away if:   You have severe abdominal pain.  You are a man and notice swelling or pain in your testicles.  You are a woman and notice swelling or pain in your vagina. Document Released: 01/16/2003 Document Revised: 10/31/2013 Document Reviewed: 05/16/2013 [facility] Patient Information 2015 [place]. This information is not intended to replace advice given to you by your health care provider. Make sure you discuss any questions you have with your health care provider.

## 2015-03-27 NOTE — ED Notes (Signed)
Pt. reports yeast infection with vaginal itching / discharge and mild low abdominal cramping onset this week , denies dysuria or fever .

## 2015-03-28 LAB — GC/CHLAMYDIA PROBE AMP (~~LOC~~) NOT AT ARMC
CHLAMYDIA, DNA PROBE: POSITIVE — AB
NEISSERIA GONORRHEA: NEGATIVE

## 2015-03-29 ENCOUNTER — Telehealth (HOSPITAL_BASED_OUTPATIENT_CLINIC_OR_DEPARTMENT_OTHER): Payer: Self-pay | Admitting: Emergency Medicine

## 2015-03-29 NOTE — Telephone Encounter (Signed)
Spoke with pt/notified / educated re abstinence/notification of sexual partner(s)

## 2015-08-11 ENCOUNTER — Encounter (HOSPITAL_COMMUNITY): Payer: Self-pay | Admitting: Emergency Medicine

## 2015-08-11 ENCOUNTER — Emergency Department (HOSPITAL_COMMUNITY)
Admission: EM | Admit: 2015-08-11 | Discharge: 2015-08-11 | Disposition: A | Discharge status: Home / Self Care | Payer: Medicaid Other | Attending: Emergency Medicine | Admitting: Emergency Medicine

## 2015-08-11 DIAGNOSIS — Z792 Long term (current) use of antibiotics: Secondary | ICD-10-CM | POA: Diagnosis not present

## 2015-08-11 DIAGNOSIS — J029 Acute pharyngitis, unspecified: Secondary | ICD-10-CM | POA: Insufficient documentation

## 2015-08-11 LAB — RAPID STREP SCREEN (MED CTR MEBANE ONLY): Streptococcus, Group A Screen (Direct): NEGATIVE

## 2015-08-11 MED ORDER — DEXAMETHASONE SODIUM PHOSPHATE 10 MG/ML IJ SOLN
10.0000 mg | Freq: Once | INTRAMUSCULAR | Status: AC
  Administered 2015-08-11: 10 mg via INTRAMUSCULAR
  Filled 2015-08-11: qty 1

## 2015-08-11 MED ORDER — PENICILLIN G BENZATHINE 1200000 UNIT/2ML IM SUSP
1.2000 10*6.[IU] | Freq: Once | INTRAMUSCULAR | Status: AC
  Administered 2015-08-11: 1.2 10*6.[IU] via INTRAMUSCULAR
  Filled 2015-08-11: qty 2

## 2015-08-11 MED ORDER — IBUPROFEN 400 MG PO TABS
600.0000 mg | ORAL_TABLET | Freq: Once | ORAL | Status: AC
  Administered 2015-08-11: 600 mg via ORAL
  Filled 2015-08-11: qty 2

## 2015-08-11 NOTE — ED Provider Notes (Signed)
CSN: [id number]     Arrival date & time 08/11/15  1615 History   First MD Initiated Contact with Patient 08/11/15 1622     Chief Complaint  Patient presents with  . Sore Throat     (Consider location/radiation/quality/duration/timing/severity/associated sxs/prior Treatment) Patient is a 19 y.o. female presenting with pharyngitis. The history is provided by the patient. No language interpreter was used.  Sore Throat This is a new problem. The current episode started in the past 7 days. The problem occurs constantly. The problem has been gradually worsening. Associated symptoms include a fever. Pertinent negatives include no congestion, rash or vomiting. The symptoms are aggravated by swallowing. Treatments tried: cold medication.    History reviewed. No pertinent past medical history. History reviewed. No pertinent past surgical history. History reviewed. No pertinent family history. Social History  Substance Use Topics  . Smoking status: Never Smoker   . Smokeless tobacco: None  . Alcohol Use: No   OB History    No data available     Review of Systems  Constitutional: Positive for fever.  HENT: Negative for congestion.   Gastrointestinal: Negative for vomiting.  Skin: Negative for rash.  All other systems reviewed and are negative.     Allergies  Review of patient's allergies indicates no known allergies.  Home Medications   Prior to Admission medications   Medication Sig Start Date End Date Taking? Authorizing Provider  amoxicillin (AMOXIL) 500 MG capsule Take 1,000 mg by mouth daily.    Historical Provider, MD  clindamycin (CLEOCIN) 300 MG capsule Take 1 capsule (300 mg total) by mouth 3 (three) times daily. Patient not taking: Reported on 02/24/2015 09/04/14   Hayden Rasmussen, NP  Cranberry-Vitamin C-Probiotic (AZO CRANBERRY) 250-30 MG TABS Take 1 tablet by mouth daily as needed (burning).    Historical Provider, MD  doxycycline (VIBRAMYCIN) 100 MG capsule Take 1 capsule  (100 mg total) by mouth 2 (two) times daily. 03/27/15   Roxy Horseman, PA-C  fluconazole (DIFLUCAN) 150 MG tablet Take 1 tablet (150 mg total) by mouth once. 03/27/15   Roxy Horseman, PA-C  HYDROcodone-acetaminophen (NORCO/VICODIN) 5-325 MG per tablet Take 1 tablet by mouth every 6 (six) hours as needed. 03/27/15   Roxy Horseman, PA-C  ibuprofen (ADVIL,MOTRIN) 400 MG tablet Take 1 tablet (400 mg total) by mouth every 6 (six) hours as needed. 02/24/15   Ankit Nanavati, MD   BP 120/79 mmHg  Pulse 129  Temp(Src) 102.4 F (39.1 C) (Oral)  Resp 16  Ht  (1.676 m)  Wt 130 lb (58.968 kg)  BMI 20.99 kg/m2  SpO2 100% Physical Exam  Constitutional: She is oriented to person, place, and time. She appears well-developed and well-nourished.  HENT:  Right Ear: External ear normal.  Left Ear: External ear normal.  Mouth/Throat: Oropharyngeal exudate, posterior oropharyngeal edema and posterior oropharyngeal erythema present. No tonsillar abscesses.  Eyes: Conjunctivae and EOM are normal. Pupils are equal, round, and reactive to light.  Neck: Normal range of motion. Neck supple.  Cardiovascular: Normal rate and regular rhythm.   Pulmonary/Chest: Effort normal and breath sounds normal.  Musculoskeletal: Normal range of motion.  Neurological: She is alert and oriented to person, place, and time.  Nursing note and vitals reviewed.   ED Course  Procedures (including critical care time) Labs Review Labs Reviewed  RAPID STREP SCREEN (NOT AT [facility])  CULTURE, GROUP A STREP    Imaging Review No results found. I have personally reviewed and evaluated these images and lab  results as part of my medical decision-making.   EKG Interpretation None      MDM   Final diagnoses:  Pharyngitis    Exam consistent with strep. Pt treated with pcn and decadron.    Teressa Lower, NP 08/11/15 1751  Vanetta Mulders, MD 08/12/15 1512

## 2015-08-11 NOTE — Discharge Instructions (Signed)

## 2015-08-11 NOTE — ED Notes (Signed)
No side effects  D/cd

## 2015-08-11 NOTE — ED Notes (Signed)
Pt c/o sore throat x 3 days with fever and body aches

## 2015-08-11 NOTE — ED Notes (Signed)
lmp sept 1st

## 2015-08-11 NOTE — ED Notes (Signed)
sorethroat for 3 days  Elevated temp

## 2015-08-11 NOTE — ED Notes (Signed)
Med given needs to wait for 30 minutes  Before she leaves

## 2015-08-13 LAB — CULTURE, GROUP A STREP

## 2015-09-19 ENCOUNTER — Emergency Department (HOSPITAL_COMMUNITY)
Admission: EM | Admit: 2015-09-19 | Discharge: 2015-09-20 | Disposition: A | Discharge status: Home / Self Care | Payer: Medicaid Other | Attending: Emergency Medicine | Admitting: Emergency Medicine

## 2015-09-19 ENCOUNTER — Emergency Department (HOSPITAL_COMMUNITY): Payer: Medicaid Other

## 2015-09-19 ENCOUNTER — Encounter (HOSPITAL_COMMUNITY): Payer: Self-pay | Admitting: Emergency Medicine

## 2015-09-19 DIAGNOSIS — K5900 Constipation, unspecified: Secondary | ICD-10-CM | POA: Diagnosis not present

## 2015-09-19 DIAGNOSIS — Z8619 Personal history of other infectious and parasitic diseases: Secondary | ICD-10-CM | POA: Insufficient documentation

## 2015-09-19 DIAGNOSIS — N739 Female pelvic inflammatory disease, unspecified: Secondary | ICD-10-CM

## 2015-09-19 DIAGNOSIS — K529 Noninfective gastroenteritis and colitis, unspecified: Secondary | ICD-10-CM | POA: Diagnosis not present

## 2015-09-19 DIAGNOSIS — R1084 Generalized abdominal pain: Secondary | ICD-10-CM | POA: Diagnosis present

## 2015-09-19 DIAGNOSIS — Z3202 Encounter for pregnancy test, result negative: Secondary | ICD-10-CM | POA: Diagnosis not present

## 2015-09-19 LAB — CBC
HCT: 28.9 % — ABNORMAL LOW (ref 36.0–46.0)
HEMOGLOBIN: 9.9 g/dL — AB (ref 12.0–15.0)
MCH: 27.3 pg (ref 26.0–34.0)
MCHC: 34.3 g/dL (ref 30.0–36.0)
MCV: 79.8 fL (ref 78.0–100.0)
Platelets: 435 10*3/uL — ABNORMAL HIGH (ref 150–400)
RBC: 3.62 MIL/uL — ABNORMAL LOW (ref 3.87–5.11)
RDW: 15.4 % (ref 11.5–15.5)
WBC: 12.3 10*3/uL — ABNORMAL HIGH (ref 4.0–10.5)

## 2015-09-19 LAB — COMPREHENSIVE METABOLIC PANEL
ALBUMIN: 3.5 g/dL (ref 3.5–5.0)
ALT: 14 U/L (ref 14–54)
ANION GAP: 7 (ref 5–15)
AST: 17 U/L (ref 15–41)
Alkaline Phosphatase: 80 U/L (ref 38–126)
BUN: <5 mg/dL — ABNORMAL LOW (ref 6–20)
CALCIUM: 9 mg/dL (ref 8.9–10.3)
CHLORIDE: 106 mmol/L (ref 101–111)
CO2: 26 mmol/L (ref 22–32)
Creatinine, Ser: 0.73 mg/dL (ref 0.44–1.00)
GFR calc Af Amer: >60 mL/min (ref >60)
GFR calc non Af Amer: >60 mL/min (ref >60)
GLUCOSE: 91 mg/dL (ref 65–99)
Potassium: 3.6 mmol/L (ref 3.5–5.1)
SODIUM: 139 mmol/L (ref 135–145)
Total Bilirubin: 0.3 mg/dL (ref 0.3–1.2)
Total Protein: 7.5 g/dL (ref 6.5–8.1)

## 2015-09-19 LAB — URINALYSIS, ROUTINE W REFLEX MICROSCOPIC
Bilirubin Urine: NEGATIVE
GLUCOSE, UA: NEGATIVE mg/dL
Ketones, ur: NEGATIVE mg/dL
Nitrite: NEGATIVE
PH: 6 (ref 5.0–8.0)
Protein, ur: NEGATIVE mg/dL
Specific Gravity, Urine: 1.02 (ref 1.005–1.030)
Urobilinogen, UA: 1 mg/dL (ref 0.0–1.0)

## 2015-09-19 LAB — URINE MICROSCOPIC-ADD ON

## 2015-09-19 LAB — LIPASE, BLOOD: LIPASE: 27 U/L (ref 11–51)

## 2015-09-19 LAB — POC URINE PREG, ED: Preg Test, Ur: NEGATIVE

## 2015-09-19 MED ORDER — IOHEXOL 300 MG/ML  SOLN
80.0000 mL | Freq: Once | INTRAMUSCULAR | Status: AC | PRN
  Administered 2015-09-20: 80 mL via INTRAVENOUS

## 2015-09-19 MED ORDER — SODIUM CHLORIDE 0.9 % IV SOLN
1000.0000 mL | INTRAVENOUS | Status: DC
  Administered 2015-09-19: 1000 mL via INTRAVENOUS

## 2015-09-19 MED ORDER — AZITHROMYCIN 1 G PO PACK
1.0000 g | PACK | Freq: Once | ORAL | Status: AC
  Administered 2015-09-19: 1 g via ORAL
  Filled 2015-09-19: qty 1

## 2015-09-19 MED ORDER — DEXTROSE 5 % IV SOLN
1.0000 g | Freq: Once | INTRAVENOUS | Status: AC
  Administered 2015-09-19: 1 g via INTRAVENOUS
  Filled 2015-09-19: qty 10

## 2015-09-19 MED ORDER — SODIUM CHLORIDE 0.9 % IV SOLN
1000.0000 mL | Freq: Once | INTRAVENOUS | Status: AC
  Administered 2015-09-19: 1000 mL via INTRAVENOUS

## 2015-09-19 MED ORDER — ONDANSETRON HCL 4 MG/2ML IJ SOLN
4.0000 mg | Freq: Once | INTRAMUSCULAR | Status: AC
  Administered 2015-09-19: 4 mg via INTRAVENOUS
  Filled 2015-09-19: qty 2

## 2015-09-19 MED ORDER — MORPHINE SULFATE (PF) 4 MG/ML IV SOLN
4.0000 mg | Freq: Once | INTRAVENOUS | Status: AC
  Administered 2015-09-19: 4 mg via INTRAVENOUS
  Filled 2015-09-19: qty 1

## 2015-09-19 NOTE — ED Notes (Signed)
Pt. reports pain " tight" across her abdomen for 5 days with nausea , denies emesis or diarrhea . No fever or chills.

## 2015-09-19 NOTE — ED Provider Notes (Signed)
CSN: [id number]     Arrival date & time 09/19/15  2157 History  By signing my name below, I, Maria Ware, attest that this documentation has been prepared under the direction and in the presence of Maria Boozeavid Belina Mandile, MD. Electronically Signed: Tanda RockersMargaux Ware, ED Scribe. 09/19/2015. 11:32 PM.  Chief Complaint  Patient presents with  . Abdominal Pain   The history is provided by the patient. No language interpreter was used.     HPI Comments: Maria Ware is a [age] y.o. female who presents to the Emergency Department complaining of gradual onset, constant, 10/10, diffuse abdominal tightness, worse in RLQ x 5 days, unchanged since onset.  She reports that the pain is exacerbated with movement. Pt also complains of nausea, constipation, and urgency. Pt was seen in the ED in April 2016 and was diagnosed with PID. Per chart review, pt was positive for chlamydia at that time. She was seen again in May 2016 and was positive for chlamydia again. She was prescribed doxy again along with diflucan. She reports that she did not finished the prescribed doxycycline and that her symptoms have been intermittent ever since. Pt states that she attempted to take the left over doxycycline a couple of weeks ago but vomited it back up. She denies diarrhea, vaginal discharge, or any other associated symptoms. LNMP: Today.    History reviewed. No pertinent past medical history. History reviewed. No pertinent past surgical history. No family history on file. Social History  Substance Use Topics  . Smoking status: Never Smoker   . Smokeless tobacco: None  . Alcohol Use: No   OB History    No data available     Review of Systems  Gastrointestinal: Positive for nausea, abdominal pain and constipation. Negative for diarrhea.  Genitourinary: Positive for urgency. Negative for vaginal discharge.  All other systems reviewed and are negative.   Allergies  Review of patient's allergies indicates no known  allergies.  Home Medications   Prior to Admission medications   Medication Sig Start Date End Date Taking? Authorizing Provider  acetaminophen (TYLENOL) 500 MG tablet Take 500 mg by mouth every 6 (six) hours as needed for mild pain.   Yes Historical Provider, MD  Acetaminophen-Caff-Pyrilamine (MIDOL COMPLETE PO) Take 1 tablet by mouth 2 (two) times daily as needed (pain).   Yes Historical Provider, MD   Triage VItals: BP 128/78 mmHg  Pulse 106  Temp(Src) 99.5 F (37.5 C) (Oral)  Resp 18  SpO2 100%  LMP 09/12/2015   Physical Exam  Constitutional: She is oriented to person, place, and time. She appears well-developed and well-nourished. No distress.  HENT:  Head: Normocephalic and atraumatic.  Eyes: Conjunctivae and EOM are normal. Pupils are equal, round, and reactive to light.  Neck: Normal range of motion. Neck supple. No JVD present.  Cardiovascular: Normal rate, regular rhythm and normal heart sounds.   No murmur heard. Pulmonary/Chest: Effort normal and breath sounds normal. She has no wheezes. She has no rales. She exhibits no tenderness.  Abdominal: Soft. She exhibits no distension and no mass. There is tenderness.  Tender diffuse with maximal tenderness suprapubic and RLQ Moderate tenderness to percussion Bowel sounds decreased No CVA tenderness  Genitourinary:  Normal external female genitalia  Cervix is closed Moderate amount of clear fluid present Marked pain on speculum exam Will not cooperate for bimanual exam  Musculoskeletal: Normal range of motion. She exhibits no edema.  Lymphadenopathy:    She has no cervical adenopathy.  Neurological: She is alert  and oriented to person, place, and time. No cranial nerve deficit. She exhibits normal muscle tone. Coordination normal.  Skin: Skin is warm and dry. No rash noted.  Psychiatric: She has a normal mood and affect. Her behavior is normal. Judgment and thought content normal.  Nursing note and vitals  reviewed.   ED Course  Procedures (including critical care time)  DIAGNOSTIC STUDIES: Oxygen Saturation is 100% on RA, normal by my interpretation.    COORDINATION OF CARE: 11:22 PM-Discussed treatment plan which includes pelvic exam, Differential, HIV antibody, RPR, Wet prep, and CT A/P  with pt at bedside and pt agreed to plan.   Labs Review Results for orders placed or performed during the hospital encounter of 09/19/15  Wet prep, genital  Result Value Ref Range   Yeast Wet Prep HPF POC NONE SEEN NONE SEEN   Trich, Wet Prep NONE SEEN NONE SEEN   Clue Cells Wet Prep HPF POC NONE SEEN NONE SEEN   WBC, Wet Prep HPF POC MODERATE (A) NONE SEEN  Lipase, blood  Result Value Ref Range   Lipase 27 11 - 51 U/L  Comprehensive metabolic panel  Result Value Ref Range   Sodium 139 135 - 145 mmol/L   Potassium 3.6 3.5 - 5.1 mmol/L   Chloride 106 101 - 111 mmol/L   CO2 26 22 - 32 mmol/L   Glucose, Bld 91 65 - 99 mg/dL   BUN <5 (L) 6 - 20 mg/dL   Creatinine, Ser 1.61 0.44 - 1.00 mg/dL   Calcium 9.0 8.9 - 09.6 mg/dL   Total Protein 7.5 6.5 - 8.1 g/dL   Albumin 3.5 3.5 - 5.0 g/dL   AST 17 15 - 41 U/L   ALT 14 14 - 54 U/L   Alkaline Phosphatase 80 38 - 126 U/L   Total Bilirubin 0.3 0.3 - 1.2 mg/dL   GFR calc non Af Amer >60 >60 mL/min   GFR calc Af Amer >60 >60 mL/min   Anion gap 7 5 - 15  CBC  Result Value Ref Range   WBC 12.3 (H) 4.0 - 10.5 K/uL   RBC 3.62 (L) 3.87 - 5.11 MIL/uL   Hemoglobin 9.9 (L) 12.0 - 15.0 g/dL   HCT 04.5 (L) 40.9 - 81.1 %   MCV 79.8 78.0 - 100.0 fL   MCH 27.3 26.0 - 34.0 pg   MCHC 34.3 30.0 - 36.0 g/dL   RDW 91.4 78.2 - 95.6 %   Platelets 435 (H) 150 - 400 K/uL  Urinalysis, Routine w reflex microscopic (not at [facility])  Result Value Ref Range   Color, Urine YELLOW YELLOW   APPearance CLOUDY (A) CLEAR   Specific Gravity, Urine 1.020 1.005 - 1.030   pH 6.0 5.0 - 8.0   Glucose, UA NEGATIVE NEGATIVE mg/dL   Hgb urine dipstick LARGE (A) NEGATIVE    Bilirubin Urine NEGATIVE NEGATIVE   Ketones, ur NEGATIVE NEGATIVE mg/dL   Protein, ur NEGATIVE NEGATIVE mg/dL   Urobilinogen, UA 1.0 0.0 - 1.0 mg/dL   Nitrite NEGATIVE NEGATIVE   Leukocytes, UA SMALL (A) NEGATIVE  Urine microscopic-add on  Result Value Ref Range   Squamous Epithelial / LPF FEW (A) RARE   WBC, UA 7-10 <3 WBC/hpf   RBC / HPF 0-2 <3 RBC/hpf   Bacteria, UA FEW (A) RARE  Differential  Result Value Ref Range   Neutrophils Relative % 69 %   Lymphocytes Relative 22 %   Monocytes Relative 4 %   Eosinophils Relative  4 %   Basophils Relative 1 %   Band Neutrophils 0 %   Metamyelocytes Relative 0 %   Myelocytes 0 %   Promyelocytes Absolute 0 %   Blasts 0 %   nRBC 0 0 /100 WBC   Other 0 %   Neutro Abs 8.5 (H) 1.7 - 7.7 K/uL   Lymphs Abs 2.7 0.7 - 4.0 K/uL   Monocytes Absolute 0.5 0.1 - 1.0 K/uL   Eosinophils Absolute 0.5 0.0 - 0.7 K/uL   Basophils Absolute 0.1 0.0 - 0.1 K/uL   RBC Morphology TARGET CELLS   POC urine preg, ED (not at [facility])  Result Value Ref Range   Preg Test, Ur NEGATIVE NEGATIVE    Imaging Review Ct Abdomen Pelvis W Contrast  09/20/2015  CLINICAL DATA:  Diffuse abdominal pain for 5 days.  Nausea. EXAM: CT ABDOMEN AND PELVIS WITH CONTRAST TECHNIQUE: Multidetector CT imaging of the abdomen and pelvis was performed using the standard protocol following bolus administration of intravenous contrast. CONTRAST:  80mL OMNIPAQUE IOHEXOL 300 MG/ML  SOLN COMPARISON:  None. FINDINGS: Lower chest:  The included lung bases are clear. Liver: Normal in size. Mild periportal edema, may be related to hydration status. Hepatobiliary: Gallbladder physiologically distended, no calcified gallstone. No biliary dilatation. Pancreas: Normal. Spleen: Normal. Adrenal glands: No nodule. Kidneys: Symmetric renal enhancement.  No hydronephrosis. Stomach/Bowel: Lack of enteric contrast and paucity of intra-abdominal fat limits enteric assessment. Stomach physiologically distended. Small  bowel loops are fluid-filled in the central abdomen, there is probable mild wall thickening. The terminal ileum is grossly normal. Air and stool throughout the colon with scattered areas of colonic wall thickening involving the transverse and descending colon. Hyperdense material within the appendix is likely from prior barium administration, normal in caliber. No periappendiceal inflammatory change. Vascular/Lymphatic: No retroperitoneal adenopathy. Abdominal aorta is normal in caliber. Reproductive: Uterus appears normal. Ovaries not confidently identified. Fluid-filled bowel loops in the adnexa. Bladder: Physiologically distended, no wall thickening. Other: There is mesenteric edema. Small amount of free fluid anteriorly in the lower abdomen and pelvis. Small amount of free fluid in the pelvic cul-de-sac. No pneumoperitoneum. No organized fluid collection. Musculoskeletal: There are no acute or suspicious osseous abnormalities. IMPRESSION: 1. Fluid-filled bowel bowel loops with scattered associated bowel wall thickening involving small and large bowel. In conjunction with mesenteric haziness and small amount of free fluid, suspect diffuse enteritis. This may be infectious or inflammatory. 2. The appendix appears normal. Electronically Signed   By: Rubye Oaks M.D.   On: 09/20/2015 01:05   I have personally reviewed and evaluated these lab results as part of my medical decision-making.   MDM   Final diagnoses:  Pelvic inflammatory disease    Pelvic pain strongly suggestive of pelvic inflammatory disease. However, I am concerned about tenderness which does seem to extend into the right lower abdomen. Old records are reviewed and she has been treated for pelvic inflammatory disease in April of this year with a 10 day course of doxycycline. Apparently, she failed to complete the course. She had another ED visit in May at which time she had positive chlamydia test and was given a 10 day course of  doxycycline which she did not complete. Severity of pain today and is worrisome, but WBC is only mildly elevated. She is sent for CT of abdomen and pelvis to rule out other pathology and this is unremarkable. Small and large bowel wall thickening is felt likely to be reactive to the process in her  pelvis. She is given an injection of ceftriaxone and a dose of oral azithromycin and is discharged with prescriptions for doxycycline and metronidazole as well as oxycodone-acetaminophen for pain. She states problems with vomiting when she had doxycycline prescriptions in the past. She's given a prescription for ondansetron to take 1 hour before the doxycycline dose. Importance of adequate treatment was stressed the patient including possibility of chronic pelvic pain and infertility from inadequately treated PID. She is referred to women's clinic for follow-up to make sure that she has adequate resolution of symptoms. She is to return should pain not be adequately controlled, should she be vomiting not holding her medication down, where should she start running a fever. Patient expresses understanding.   I personally performed the services described in this documentation, which was scribed in my presence. The recorded information has been reviewed and is accurate.        Maria Booze, MD 09/20/15 [phone]

## 2015-09-19 NOTE — ED Notes (Signed)
Prepared patient for pelvic exam

## 2015-09-19 NOTE — ED Notes (Signed)
Patient reports she was diagnosed with PID around march, and did not complete her medications.

## 2015-09-20 ENCOUNTER — Emergency Department (HOSPITAL_COMMUNITY)
Admission: EM | Admit: 2015-09-20 | Discharge: 2015-09-20 | Disposition: A | Discharge status: Home / Self Care | Payer: Medicaid Other | Attending: Emergency Medicine | Admitting: Emergency Medicine

## 2015-09-20 ENCOUNTER — Encounter (HOSPITAL_COMMUNITY): Payer: Self-pay

## 2015-09-20 ENCOUNTER — Emergency Department (HOSPITAL_COMMUNITY): Payer: Medicaid Other

## 2015-09-20 ENCOUNTER — Encounter (HOSPITAL_COMMUNITY): Payer: Self-pay | Admitting: Emergency Medicine

## 2015-09-20 DIAGNOSIS — Z8742 Personal history of other diseases of the female genital tract: Secondary | ICD-10-CM | POA: Diagnosis not present

## 2015-09-20 DIAGNOSIS — K529 Noninfective gastroenteritis and colitis, unspecified: Secondary | ICD-10-CM | POA: Insufficient documentation

## 2015-09-20 DIAGNOSIS — R1011 Right upper quadrant pain: Secondary | ICD-10-CM | POA: Diagnosis present

## 2015-09-20 HISTORY — DX: Female pelvic inflammatory disease, unspecified: N73.9

## 2015-09-20 LAB — GC/CHLAMYDIA PROBE AMP (~~LOC~~) NOT AT ARMC
CHLAMYDIA, DNA PROBE: POSITIVE — AB
Neisseria Gonorrhea: NEGATIVE

## 2015-09-20 LAB — DIFFERENTIAL
BAND NEUTROPHILS: 0 %
BASOS PCT: 1 %
Basophils Absolute: 0.1 10*3/uL (ref 0.0–0.1)
Blasts: 0 %
EOS ABS: 0.5 10*3/uL (ref 0.0–0.7)
Eosinophils Relative: 4 %
LYMPHS ABS: 2.7 10*3/uL (ref 0.7–4.0)
Lymphocytes Relative: 22 %
METAMYELOCYTES PCT: 0 %
MONO ABS: 0.5 10*3/uL (ref 0.1–1.0)
MYELOCYTES: 0 %
Monocytes Relative: 4 %
NEUTROS ABS: 8.5 10*3/uL — AB (ref 1.7–7.7)
NRBC: 0 /100{WBCs}
Neutrophils Relative %: 69 %
OTHER: 0 %
PROMYELOCYTES ABS: 0 %

## 2015-09-20 LAB — CBC WITH DIFFERENTIAL/PLATELET
Basophils Absolute: 0 10*3/uL (ref 0.0–0.1)
Basophils Relative: 0 %
EOS PCT: 0 %
Eosinophils Absolute: 0 10*3/uL (ref 0.0–0.7)
HCT: 26.1 % — ABNORMAL LOW (ref 36.0–46.0)
HEMOGLOBIN: 8.9 g/dL — AB (ref 12.0–15.0)
LYMPHS PCT: 22 %
Lymphs Abs: 2.6 10*3/uL (ref 0.7–4.0)
MCH: 26.9 pg (ref 26.0–34.0)
MCHC: 34.1 g/dL (ref 30.0–36.0)
MCV: 78.9 fL (ref 78.0–100.0)
MONO ABS: 0.6 10*3/uL (ref 0.1–1.0)
Monocytes Relative: 5 %
NEUTROS PCT: 73 %
Neutro Abs: 8.6 10*3/uL — ABNORMAL HIGH (ref 1.7–7.7)
PLATELETS: 446 10*3/uL — AB (ref 150–400)
RBC: 3.31 MIL/uL — AB (ref 3.87–5.11)
RDW: 15.3 % (ref 11.5–15.5)
WBC: 11.8 10*3/uL — AB (ref 4.0–10.5)

## 2015-09-20 LAB — HIV ANTIBODY (ROUTINE TESTING W REFLEX): HIV SCREEN 4TH GENERATION: NONREACTIVE

## 2015-09-20 LAB — COMPREHENSIVE METABOLIC PANEL
ALT: 12 U/L — AB (ref 14–54)
ANION GAP: 6 (ref 5–15)
AST: 16 U/L (ref 15–41)
Albumin: 3.3 g/dL — ABNORMAL LOW (ref 3.5–5.0)
Alkaline Phosphatase: 79 U/L (ref 38–126)
BUN: <5 mg/dL — ABNORMAL LOW (ref 6–20)
CHLORIDE: 107 mmol/L (ref 101–111)
CO2: 26 mmol/L (ref 22–32)
CREATININE: 0.73 mg/dL (ref 0.44–1.00)
Calcium: 8.8 mg/dL — ABNORMAL LOW (ref 8.9–10.3)
Glucose, Bld: 93 mg/dL (ref 65–99)
POTASSIUM: 3.5 mmol/L (ref 3.5–5.1)
SODIUM: 139 mmol/L (ref 135–145)
Total Bilirubin: 0.2 mg/dL — ABNORMAL LOW (ref 0.3–1.2)
Total Protein: 6.8 g/dL (ref 6.5–8.1)

## 2015-09-20 LAB — RPR: RPR: NONREACTIVE

## 2015-09-20 LAB — WET PREP, GENITAL
Clue Cells Wet Prep HPF POC: NONE SEEN
Trich, Wet Prep: NONE SEEN
YEAST WET PREP: NONE SEEN

## 2015-09-20 MED ORDER — FLUCONAZOLE 150 MG PO TABS: 150.0000 mg | ORAL_TABLET | Freq: Every day | ORAL | Status: DC

## 2015-09-20 MED ORDER — ONDANSETRON 4 MG PO TBDP
4.0000 mg | ORAL_TABLET | Freq: Once | ORAL | Status: AC
  Administered 2015-09-20: 4 mg via ORAL
  Filled 2015-09-20: qty 1

## 2015-09-20 MED ORDER — ONDANSETRON HCL 4 MG PO TABS: 4.0000 mg | ORAL_TABLET | Freq: Four times a day (QID) | ORAL | Status: DC | PRN

## 2015-09-20 MED ORDER — METRONIDAZOLE 500 MG PO TABS: 500.0000 mg | ORAL_TABLET | Freq: Two times a day (BID) | ORAL | Status: DC

## 2015-09-20 MED ORDER — DOXYCYCLINE HYCLATE 100 MG PO CAPS: 100.0000 mg | ORAL_CAPSULE | Freq: Two times a day (BID) | ORAL | Status: DC

## 2015-09-20 MED ORDER — ACETAMINOPHEN 500 MG PO TABS
1000.0000 mg | ORAL_TABLET | Freq: Once | ORAL | Status: AC
  Administered 2015-09-20: 1000 mg via ORAL
  Filled 2015-09-20: qty 2

## 2015-09-20 MED ORDER — IBUPROFEN 800 MG PO TABS
800.0000 mg | ORAL_TABLET | Freq: Once | ORAL | Status: AC
  Administered 2015-09-20: 800 mg via ORAL
  Filled 2015-09-20: qty 1

## 2015-09-20 MED ORDER — OXYCODONE-ACETAMINOPHEN 5-325 MG PO TABS: 1.0000 | ORAL_TABLET | ORAL | Status: DC | PRN

## 2015-09-20 MED ORDER — METRONIDAZOLE 500 MG PO TABS
500.0000 mg | ORAL_TABLET | Freq: Once | ORAL | Status: AC
  Administered 2015-09-20: 500 mg via ORAL
  Filled 2015-09-20: qty 1

## 2015-09-20 MED ORDER — ONDANSETRON 4 MG PO TBDP: 4.0000 mg | ORAL_TABLET | Freq: Three times a day (TID) | ORAL | Status: DC | PRN

## 2015-09-20 NOTE — ED Notes (Signed)
Dr. Rubin PayorPickering notified of pt being seen in ED last night for PID.  Lab orders received.

## 2015-09-20 NOTE — ED Notes (Signed)
Dr. Preston FleetingGlick in to see the patient.

## 2015-09-20 NOTE — ED Notes (Signed)
Pt wen right to the vending machines when she  Walked in after registration

## 2015-09-20 NOTE — ED Notes (Signed)
Pt went into the restroom

## 2015-09-20 NOTE — ED Notes (Addendum)
C/o pain under R breast, sob, and nausea today.  States she was seen in ED yesterday for PID.  Took Percocet 1 hour ago.

## 2015-09-20 NOTE — Discharge Instructions (Signed)
Return if pain is not being adequately controlled, you start running a high fever, or if you are vomiting and not able to hold the antibiotics down. Tried taking ondansetron about one hour prior to taking the antibiotic to try to limit problems with nausea. Make sure to follow-up with the [facility] clinic to make sure that you have responded adequately to treatment. Sometimes, treatment needs to be done inside the hospital to get a cure.  Pelvic Inflammatory Disease Pelvic inflammatory disease (PID) refers to an infection in some or all of the female organs. The infection can be in the uterus, ovaries, fallopian tubes, or the surrounding tissues in the pelvis. PID can cause abdominal or pelvic pain that comes on suddenly (acute pelvic pain). PID is a serious infection because it can lead to lasting (chronic) pelvic pain or the inability to have children (infertility). CAUSES This condition is most often caused by an infection that is spread during sexual contact. However, the infection can also be caused by the normal bacteria that are found in the vaginal tissues if these bacteria travel upward into the reproductive organs. PID can also occur following:  The birth of a baby.  A miscarriage.  An abortion.  Major pelvic surgery.  The use of an intrauterine device (IUD).  A sexual assault. RISK FACTORS This condition is more likely to develop in women who:  Are younger than 20 years of age.  [facility].  Use nonbarrier contraception.  Have multiple sexual partners.  Have sex with someone who has symptoms of an STD (sexually transmitted disease).  Use oral contraception. At times, certain behaviors can also increase the possibility of getting PID, such as:  Using a vaginal douche.  Having an IUD in place. SYMPTOMS Symptoms of this condition include:  Abdominal or pelvic pain.  Fever.  Chills.  Abnormal vaginal discharge.  Abnormal uterine  bleeding.  Unusual pain shortly after the end of a menstrual period.  Painful urination.  Pain with sexual intercourse.  Nausea and vomiting. DIAGNOSIS To diagnose this condition, your health care provider will do a physical exam and take your medical history. A pelvic exam typically reveals great tenderness in the uterus and the surrounding pelvic tissues. You may also have tests, such as:  Lab tests, including a pregnancy test, blood tests, and urine test.  Culture tests of the vagina and cervix to check for an STD.  Ultrasound.  A laparoscopic procedure to look inside the pelvis.  Examining vaginal secretions under a microscope. TREATMENT Treatment for this condition may involve one or more approaches.  Antibiotic medicines may be prescribed to be taken by mouth.  Sexual partners may need to be treated if the infection is caused by an STD.  For more severe cases, hospitalization may be needed to give antibiotics directly into a vein through an IV tube.  Surgery may be needed if other treatments do not help, but this is rare. It may take weeks until you are completely well. If you are diagnosed with PID, you should also be checked for human immunodeficiency virus (HIV). Your health care provider may test you for infection again 3 months after treatment. You should not have unprotected sex. HOME CARE INSTRUCTIONS  Take over-the-counter and prescription medicines only as told by your health care provider.  If you were prescribed an antibiotic medicine, take it as told by your health care provider. Do not stop taking the antibiotic even if you start to feel better.  Do not have sexual intercourse until treatment is completed or as told by your health care provider. If PID is confirmed, your recent sexual partners will need treatment, especially if you had unprotected sex.  Keep all follow-up visits as told by your health care provider. This is important. SEEK MEDICAL CARE  IF:  You have increased or abnormal vaginal discharge.  Your pain does not improve.  You vomit.  You have a fever.  You cannot tolerate your medicines.  Your partner has an STD.  You have pain when you urinate. SEEK IMMEDIATE MEDICAL CARE IF:  You have increased abdominal or pelvic pain.  You have chills.  Your symptoms are not better in 72 hours even with treatment.   This information is not intended to replace advice given to you by your health care provider. Make sure you discuss any questions you have with your health care provider.   Document Released: 10/26/2005 Document Revised: 07/17/2015 Document Reviewed: 12/03/2014 Elsevier Interactive Patient Education 2016 [employer]  Doxycycline tablets or capsules What is this medicine? DOXYCYCLINE (dox i SYE kleen) is a tetracycline antibiotic. It kills certain bacteria or stops their growth. It is used to treat many kinds of infections, like dental, skin, respiratory, and urinary tract infections. It also treats acne, Lyme disease, malaria, and certain sexually transmitted infections. This medicine may be used for other purposes; ask your health care provider or pharmacist if you have questions. What should I tell my health care provider before I take this medicine? They need to know if you have any of these conditions: -liver disease -long exposure to sunlight like working outdoors -stomach problems like colitis -an unusual or allergic reaction to doxycycline, tetracycline antibiotics, other medicines, foods, dyes, or preservatives -pregnant or trying to get pregnant -breast-feeding How should I use this medicine? Take this medicine by mouth with a full glass of water. Follow the directions on the prescription label. It is best to take this medicine without food, but if it upsets your stomach take it with food. Take your medicine at regular intervals. Do not take your medicine more often than directed. Take all of your  medicine as directed even if you think you are better. Do not skip doses or stop your medicine early. Talk to your pediatrician regarding the use of this medicine in children. While this drug may be prescribed for selected conditions, precautions do apply. Overdosage: If you think you have taken too much of this medicine contact a poison control center or emergency room at once. NOTE: This medicine is only for you. Do not share this medicine with others. What if I miss a dose? If you miss a dose, take it as soon as you can. If it is almost time for your next dose, take only that dose. Do not take double or extra doses. What may interact with this medicine? -antacids -barbiturates -birth control pills -bismuth subsalicylate -carbamazepine -methoxyflurane -other antibiotics -phenytoin -vitamins that contain iron -warfarin This list may not describe all possible interactions. Give your health care provider a list of all the medicines, herbs, non-prescription drugs, or dietary supplements you use. Also tell them if you smoke, drink alcohol, or use illegal drugs. Some items may interact with your medicine. What should I watch for while using this medicine? Tell your doctor or health care professional if your symptoms do not improve. Do not treat diarrhea with over the counter products. Contact your doctor if you have diarrhea that lasts more than 2 days or  if it is severe and watery. Do not take this medicine just before going to bed. It may not dissolve properly when you lay down and can cause pain in your throat. Drink plenty of fluids while taking this medicine to also help reduce irritation in your throat. This medicine can make you more sensitive to the sun. Keep out of the sun. If you cannot avoid being in the sun, wear protective clothing and use sunscreen. Do not use sun lamps or tanning beds/booths. Birth control pills may not work properly while you are taking this medicine. Talk to your  doctor about using an extra method of birth control. If you are being treated for a sexually transmitted infection, avoid sexual contact until you have finished your treatment. Your sexual partner may also need treatment. Avoid antacids, aluminum, calcium, magnesium, and iron products for 4 hours before and 2 hours after taking a dose of this medicine. If you are using this medicine to prevent malaria, you should still protect yourself from contact with mosquitos. Stay in screened-in areas, use mosquito nets, keep your body covered, and use an insect repellent. What side effects may I notice from receiving this medicine? Side effects that you should report to your doctor or health care professional as soon as possible: -allergic reactions like skin rash, itching or hives, swelling of the face, lips, or tongue -difficulty breathing -fever -itching in the rectal or genital area -pain on swallowing -redness, blistering, peeling or loosening of the skin, including inside the mouth -severe stomach pain or cramps -unusual bleeding or bruising -unusually weak or tired -yellowing of the eyes or skin Side effects that usually do not require medical attention (report to your doctor or health care professional if they continue or are bothersome): -diarrhea -loss of appetite -nausea, vomiting This list may not describe all possible side effects. Call your doctor for medical advice about side effects. You may report side effects to FDA at 1-800-FDA-1088. Where should I keep my medicine? Keep out of the reach of children. Store at room temperature, below 30 degrees C (86 degrees F). Protect from light. Keep container tightly closed. Throw away any unused medicine after the expiration date. Taking this medicine after the expiration date can make you seriously ill. NOTE: This sheet is a summary. It may not cover all possible information. If you have questions about this medicine, talk to your doctor,  pharmacist, or health care provider.    2016, [employer]. (2015-02-15 12:10:28)  Metronidazole tablets or capsules What is this medicine? METRONIDAZOLE (me troe NI da zole) is an antiinfective. It is used to treat certain kinds of bacterial and protozoal infections. It will not work for colds, flu, or other viral infections. This medicine may be used for other purposes; ask your health care provider or pharmacist if you have questions. What should I tell my health care provider before I take this medicine? They need to know if you have any of these conditions: -anemia or other blood disorders -disease of the nervous system -fungal or yeast infection -if you drink alcohol containing drinks -liver disease -seizures -an unusual or allergic reaction to metronidazole, or other medicines, foods, dyes, or preservatives -pregnant or trying to get pregnant -breast-feeding How should I use this medicine? Take this medicine by mouth with a full glass of water. Follow the directions on the prescription label. Take your medicine at regular intervals. Do not take your medicine more often than directed. Take all of your medicine as directed even  if you think you are better. Do not skip doses or stop your medicine early. Talk to your pediatrician regarding the use of this medicine in children. Special care may be needed. Overdosage: If you think you have taken too much of this medicine contact a poison control center or emergency room at once. NOTE: This medicine is only for you. Do not share this medicine with others. What if I miss a dose? If you miss a dose, take it as soon as you can. If it is almost time for your next dose, take only that dose. Do not take double or extra doses. What may interact with this medicine? Do not take this medicine with any of the following medications: -alcohol or any product that contains alcohol -amprenavir oral  solution -cisapride -disulfiram -dofetilide -dronedarone -paclitaxel injection -pimozide -ritonavir oral solution -sertraline oral solution -sulfamethoxazole-trimethoprim injection -thioridazine -ziprasidone This medicine may also interact with the following medications: -birth control pills -cimetidine -lithium -other medicines that prolong the QT interval (cause an abnormal heart rhythm) -phenobarbital -phenytoin -warfarin This list may not describe all possible interactions. Give your health care provider a list of all the medicines, herbs, non-prescription drugs, or dietary supplements you use. Also tell them if you smoke, drink alcohol, or use illegal drugs. Some items may interact with your medicine. What should I watch for while using this medicine? Tell your doctor or health care professional if your symptoms do not improve or if they get worse. You may get drowsy or dizzy. Do not drive, use machinery, or do anything that needs mental alertness until you know how this medicine affects you. Do not stand or sit up quickly, especially if you are an older patient. This reduces the risk of dizzy or fainting spells. Avoid alcoholic drinks while you are taking this medicine and for three days afterward. Alcohol may make you feel dizzy, sick, or flushed. If you are being treated for a sexually transmitted disease, avoid sexual contact until you have finished your treatment. Your sexual partner may also need treatment. What side effects may I notice from receiving this medicine? Side effects that you should report to your doctor or health care professional as soon as possible: -allergic reactions like skin rash or hives, swelling of the face, lips, or tongue -confusion, clumsiness -difficulty speaking -discolored or sore mouth -dizziness -fever, infection -numbness, tingling, pain or weakness in the hands or feet -trouble passing urine or change in the amount of urine -redness,  blistering, peeling or loosening of the skin, including inside the mouth -seizures -unusually weak or tired -vaginal irritation, dryness, or discharge Side effects that usually do not require medical attention (report to your doctor or health care professional if they continue or are bothersome): -diarrhea -headache -irritability -metallic taste -nausea -stomach pain or cramps -trouble sleeping This list may not describe all possible side effects. Call your doctor for medical advice about side effects. You may report side effects to FDA at 1-800-FDA-1088. Where should I keep my medicine? Keep out of the reach of children. Store at room temperature below 25 degrees C (77 degrees F). Protect from light. Keep container tightly closed. Throw away any unused medicine after the expiration date. NOTE: This sheet is a summary. It may not cover all possible information. If you have questions about this medicine, talk to your doctor, pharmacist, or health care provider.    2016, Elsevier/[employer] Standard. (2013-06-02 14:08:39)  Ondansetron tablets What is this medicine? ONDANSETRON (on DAN se tron) is used to  treat nausea and vomiting caused by chemotherapy. It is also used to prevent or treat nausea and vomiting after surgery. This medicine may be used for other purposes; ask your health care provider or pharmacist if you have questions. What should I tell my health care provider before I take this medicine? They need to know if you have any of these conditions: -heart disease -history of irregular heartbeat -liver disease -low levels of magnesium or potassium in the blood -an unusual or allergic reaction to ondansetron, granisetron, other medicines, foods, dyes, or preservatives -pregnant or trying to get pregnant -breast-feeding How should I use this medicine? Take this medicine by mouth with a glass of water. Follow the directions on your prescription label. Take your doses at regular  intervals. Do not take your medicine more often than directed. Talk to your pediatrician regarding the use of this medicine in children. Special care may be needed. Overdosage: If you think you have taken too much of this medicine contact a poison control center or emergency room at once. NOTE: This medicine is only for you. Do not share this medicine with others. What if I miss a dose? If you miss a dose, take it as soon as you can. If it is almost time for your next dose, take only that dose. Do not take double or extra doses. What may interact with this medicine? Do not take this medicine with any of the following medications: -apomorphine -certain medicines for fungal infections like fluconazole, itraconazole, ketoconazole, posaconazole, voriconazole -cisapride -dofetilide -dronedarone -pimozide -thioridazine -ziprasidone This medicine may also interact with the following medications: -carbamazepine -certain medicines for depression, anxiety, or psychotic disturbances -fentanyl -linezolid -MAOIs like Carbex, Eldepryl, Marplan, Nardil, and Parnate -methylene blue (injected into a vein) -other medicines that prolong the QT interval (cause an abnormal heart rhythm) -phenytoin -rifampicin -tramadol This list may not describe all possible interactions. Give your health care provider a list of all the medicines, herbs, non-prescription drugs, or dietary supplements you use. Also tell them if you smoke, drink alcohol, or use illegal drugs. Some items may interact with your medicine. What should I watch for while using this medicine? Check with your doctor or health care professional right away if you have any sign of an allergic reaction. What side effects may I notice from receiving this medicine? Side effects that you should report to your doctor or health care professional as soon as possible: -allergic reactions like skin rash, itching or hives, swelling of the face, lips or  tongue -breathing problems -confusion -dizziness -fast or irregular heartbeat -feeling faint or lightheaded, falls -fever and chills -loss of balance or coordination -seizures -sweating -swelling of the hands or feet -tightness in the chest -tremors -unusually weak or tired Side effects that usually do not require medical attention (report to your doctor or health care professional if they continue or are bothersome): -constipation or diarrhea -headache This list may not describe all possible side effects. Call your doctor for medical advice about side effects. You may report side effects to FDA at 1-800-FDA-1088. Where should I keep my medicine? Keep out of the reach of children. Store between 2 and 30 degrees C (36 and 86 degrees F). Throw away any unused medicine after the expiration date. NOTE: This sheet is a summary. It may not cover all possible information. If you have questions about this medicine, talk to your doctor, pharmacist, or health care provider.    2016, [employer] Standard. (2013-08-02 16:27:45)  Acetaminophen; Oxycodone tablets What  is this medicine? ACETAMINOPHEN; OXYCODONE (a set a MEE noe fen; ox i KOE done) is a pain reliever. It is used to treat moderate to severe pain. This medicine may be used for other purposes; ask your health care provider or pharmacist if you have questions. What should I tell my health care provider before I take this medicine? They need to know if you have any of these conditions: -brain tumor -Crohn's disease, inflammatory bowel disease, or ulcerative colitis -drug abuse or addiction -head injury -heart or circulation problems -if you often drink alcohol -kidney disease or problems going to the bathroom -liver disease -lung disease, asthma, or breathing problems -an unusual or allergic reaction to acetaminophen, oxycodone, other opioid analgesics, other medicines, foods, dyes, or preservatives -pregnant or trying to get  pregnant -breast-feeding How should I use this medicine? Take this medicine by mouth with a full glass of water. Follow the directions on the prescription label. You can take it with or without food. If it upsets your stomach, take it with food. Take your medicine at regular intervals. Do not take it more often than directed. Talk to your pediatrician regarding the use of this medicine in children. Special care may be needed. Patients over 44 years old may have a stronger reaction and need a smaller dose. Overdosage: If you think you have taken too much of this medicine contact a poison control center or emergency room at once. NOTE: This medicine is only for you. Do not share this medicine with others. What if I miss a dose? If you miss a dose, take it as soon as you can. If it is almost time for your next dose, take only that dose. Do not take double or extra doses. What may interact with this medicine? -alcohol -antihistamines -barbiturates like amobarbital, butalbital, butabarbital, methohexital, pentobarbital, phenobarbital, thiopental, and secobarbital -benztropine -drugs for bladder problems like solifenacin, trospium, oxybutynin, tolterodine, hyoscyamine, and methscopolamine -drugs for breathing problems like ipratropium and tiotropium -drugs for certain stomach or intestine problems like propantheline, homatropine methylbromide, glycopyrrolate, atropine, belladonna, and dicyclomine -general anesthetics like etomidate, ketamine, nitrous oxide, propofol, desflurane, enflurane, halothane, isoflurane, and sevoflurane -medicines for depression, anxiety, or psychotic disturbances -medicines for sleep -muscle relaxants -naltrexone -narcotic medicines (opiates) for pain -phenothiazines like perphenazine, thioridazine, chlorpromazine, mesoridazine, fluphenazine, prochlorperazine, promazine, and trifluoperazine -scopolamine -tramadol -trihexyphenidyl This list may not describe all possible  interactions. Give your health care provider a list of all the medicines, herbs, non-prescription drugs, or dietary supplements you use. Also tell them if you smoke, drink alcohol, or use illegal drugs. Some items may interact with your medicine. What should I watch for while using this medicine? Tell your doctor or health care professional if your pain does not go away, if it gets worse, or if you have new or a different type of pain. You may develop tolerance to the medicine. Tolerance means that you will need a higher dose of the medication for pain relief. Tolerance is normal and is expected if you take this medicine for a long time. Do not suddenly stop taking your medicine because you may develop a severe reaction. Your body becomes used to the medicine. This does NOT mean you are addicted. Addiction is a behavior related to getting and using a drug for a non-medical reason. If you have pain, you have a medical reason to take pain medicine. Your doctor will tell you how much medicine to take. If your doctor wants you to stop the medicine, the dose will  be slowly lowered over time to avoid any side effects. You may get drowsy or dizzy. Do not drive, use machinery, or do anything that needs mental alertness until you know how this medicine affects you. Do not stand or sit up quickly, especially if you are an older patient. This reduces the risk of dizzy or fainting spells. Alcohol may interfere with the effect of this medicine. Avoid alcoholic drinks. There are different types of narcotic medicines (opiates) for pain. If you take more than one type at the same time, you may have more side effects. Give your health care provider a list of all medicines you use. Your doctor will tell you how much medicine to take. Do not take more medicine than directed. Call emergency for help if you have problems breathing. The medicine will cause constipation. Try to have a bowel movement at least every 2 to 3 days. If  you do not have a bowel movement for 3 days, call your doctor or health care professional. Do not take Tylenol (acetaminophen) or medicines that have acetaminophen with this medicine. Too much acetaminophen can be very dangerous. Many nonprescription medicines contain acetaminophen. Always read the labels carefully to avoid taking more acetaminophen. What side effects may I notice from receiving this medicine? Side effects that you should report to your doctor or health care professional as soon as possible: -allergic reactions like skin rash, itching or hives, swelling of the face, lips, or tongue -breathing difficulties, wheezing -confusion -light headedness or fainting spells -severe stomach pain -unusually weak or tired -yellowing of the skin or the whites of the eyes Side effects that usually do not require medical attention (report to your doctor or health care professional if they continue or are bothersome): -dizziness -drowsiness -nausea -vomiting This list may not describe all possible side effects. Call your doctor for medical advice about side effects. You may report side effects to FDA at 1-800-FDA-1088. Where should I keep my medicine? Keep out of the reach of children. This medicine can be abused. Keep your medicine in a safe place to protect it from theft. Do not share this medicine with anyone. Selling or giving away this medicine is dangerous and against the law. This medicine may cause accidental overdose and death if it taken by other adults, children, or pets. Mix any unused medicine with a substance like cat litter or coffee grounds. Then throw the medicine away in a sealed container like a sealed bag or a coffee can with a lid. Do not use the medicine after the expiration date. Store at room temperature between 20 and 25 degrees C (68 and 77 degrees F). NOTE: This sheet is a summary. It may not cover all possible information. If you have questions about this medicine, talk  to your doctor, pharmacist, or health care provider.    2016, Elsevier/Gold Standard. (2014-09-26 15:18:46)

## 2015-09-20 NOTE — Discharge Instructions (Signed)
Take your doxycycline with plenty of water. He can otherwise cause a significant amount of pain in your esophagus. Food Choices to Help Relieve Diarrhea, Adult When you have diarrhea, the foods you eat and your eating habits are very important. Choosing the right foods and drinks can help relieve diarrhea. Also, because diarrhea can last up to 7 days, you need to replace lost fluids and electrolytes (such as sodium, potassium, and chloride) in order to help prevent dehydration.  WHAT GENERAL GUIDELINES DO I NEED TO FOLLOW?  Slowly drink 1 cup (8 oz) of fluid for each episode of diarrhea. If you are getting enough fluid, your urine will be clear or pale yellow.  Eat starchy foods. Some good choices include white rice, white toast, pasta, low-fiber cereal, baked potatoes (without the skin), saltine crackers, and bagels.  Avoid large servings of any cooked vegetables.  Limit fruit to two servings per day. A serving is  cup or 1 small piece.  Choose foods with less than 2 g of fiber per serving.  Limit fats to less than 8 tsp (38 g) per day.  Avoid fried foods.  Eat foods that have probiotics in them. Probiotics can be found in certain dairy products.  Avoid foods and beverages that may increase the speed at which food moves through the stomach and intestines (gastrointestinal tract). Things to avoid include:  High-fiber foods, such as dried fruit, raw fruits and vegetables, nuts, seeds, and whole grain foods.  Spicy foods and high-fat foods.  Foods and beverages sweetened with high-fructose corn syrup, honey, or sugar alcohols such as xylitol, sorbitol, and mannitol. WHAT FOODS ARE RECOMMENDED? Grains White rice. White, JamaicaFrench, or pita breads (fresh or toasted), including plain rolls, buns, or bagels. White pasta. Saltine, soda, or graham crackers. Pretzels. Low-fiber cereal. Cooked cereals made with water (such as cornmeal, farina, or cream cereals). Plain muffins. Matzo. Melba toast.  Zwieback.  Vegetables Potatoes (without the skin). Strained tomato and vegetable juices. Most well-cooked and canned vegetables without seeds. Tender lettuce. Fruits Cooked or canned applesauce, apricots, cherries, fruit cocktail, grapefruit, peaches, pears, or plums. Fresh bananas, apples without skin, cherries, grapes, cantaloupe, grapefruit, peaches, oranges, or plums.  Meat and Other Protein Products Baked or boiled chicken. Eggs. Tofu. Fish. Seafood. Smooth peanut butter. Ground or well-cooked tender beef, ham, veal, lamb, pork, or poultry.  Dairy Plain yogurt, kefir, and unsweetened liquid yogurt. Lactose-free milk, buttermilk, or soy milk. Plain hard cheese. Beverages Sport drinks. Clear broths. Diluted fruit juices (except prune). Regular, caffeine-free sodas such as ginger ale. Water. Decaffeinated teas. Oral rehydration solutions. Sugar-free beverages not sweetened with sugar alcohols. Other Bouillon, broth, or soups made from recommended foods.  The items listed above may not be a complete list of recommended foods or beverages. Contact your dietitian for more options. WHAT FOODS ARE NOT RECOMMENDED? Grains Whole grain, whole wheat, bran, or rye breads, rolls, pastas, crackers, and cereals. Wild or brown rice. Cereals that contain more than 2 g of fiber per serving. Corn tortillas or taco shells. Cooked or dry oatmeal. Granola. Popcorn. Vegetables Raw vegetables. Cabbage, broccoli, Brussels sprouts, artichokes, baked beans, beet greens, corn, kale, legumes, peas, sweet potatoes, and yams. Potato skins. Cooked spinach and cabbage. Fruits Dried fruit, including raisins and dates. Raw fruits. Stewed or dried prunes. Fresh apples with skin, apricots, mangoes, pears, raspberries, and strawberries.  Meat and Other Protein Products Chunky peanut butter. Nuts and seeds. Beans and lentils. Tomasa BlaseBacon.  Dairy High-fat cheeses. Milk, chocolate milk, and beverages  made with milk, such as milk  shakes. Cream. Ice cream. Sweets and Desserts Sweet rolls, doughnuts, and sweet breads. Pancakes and waffles. Fats and Oils Butter. Cream sauces. Margarine. Salad oils. Plain salad dressings. Olives. Avocados.  Beverages Caffeinated beverages (such as coffee, tea, soda, or energy drinks). Alcoholic beverages. Fruit juices with pulp. Prune juice. Soft drinks sweetened with high-fructose corn syrup or sugar alcohols. Other Coconut. Hot sauce. Chili powder. Mayonnaise. Gravy. Cream-based or milk-based soups.  The items listed above may not be a complete list of foods and beverages to avoid. Contact your dietitian for more information. WHAT SHOULD I DO IF I BECOME DEHYDRATED? Diarrhea can sometimes lead to dehydration. Signs of dehydration include dark urine and dry mouth and skin. If you think you are dehydrated, you should rehydrate with an oral rehydration solution. These solutions can be purchased at pharmacies, retail stores, or online.  Drink -1 cup (120-240 mL) of oral rehydration solution each time you have an episode of diarrhea. If drinking this amount makes your diarrhea worse, try drinking smaller amounts more often. For example, drink 1-3 tsp (5-15 mL) every 5-10 minutes.  A general rule for staying hydrated is to drink 1-2 L of fluid per day. Talk to your health care provider about the specific amount you should be drinking each day. Drink enough fluids to keep your urine clear or pale yellow.   This information is not intended to replace advice given to you by your health care provider. Make sure you discuss any questions you have with your health care provider.   Document Released: 01/16/2004 Document Revised: 11/16/2014 Document Reviewed: 09/18/2013 Elsevier Interactive Patient Education [employer]

## 2015-09-20 NOTE — ED Notes (Signed)
Patient agrees to have CT completed, CT notified.

## 2015-09-20 NOTE — ED Provider Notes (Signed)
CSN: [id number]     Arrival date & time 09/20/15  1921 History   First MD Initiated Contact with Patient 09/20/15 2130     Chief Complaint  Patient presents with  . Chest Pain     (Consider location/radiation/quality/duration/timing/severity/associated sxs/prior Treatment) Patient is a 19 y.o. female presenting with chest pain. The history is provided by the patient.  Chest Pain Pain location:  R chest Pain quality: sharp and shooting   Pain radiates to:  Does not radiate Pain radiates to the back: no   Pain severity:  Moderate Onset quality:  Sudden Duration:  5 hours Timing:  Constant Progression:  Partially resolved Chronicity:  New Relieved by:  Nothing Worsened by:  Nothing tried Ineffective treatments:  None tried Associated symptoms: abdominal pain and fever   Associated symptoms: no dizziness, no headache, no nausea, no palpitations, no shortness of breath and not vomiting    19 yo F with a chief complaint of right upper quadrant pain. This been going on for about 5 hours. Patient was recently seen yesterday for about 8 months of off and on vaginal discharge pelvic pain. Patient was also complaining of vomiting and diarrhea. Had a CT scan of the abdomen and pelvis that was concerning for enteritis. Patient had a pelvic exam that was concerning for PID and was treated with Rocephin and started on Doxy and Flagyl. Patient states that since she started the medicine she has had a pain that is worse on the right upper quadrant area. Worse with taking the medicine as well as mild worsening with eating. Worse with positioning as well. Nothing seems to make it better.  Past Medical History  Diagnosis Date  . PID (pelvic inflammatory disease)    History reviewed. No pertinent past surgical history. No family history on file. Social History  Substance Use Topics  . Smoking status: Never Smoker   . Smokeless tobacco: None  . Alcohol Use: No   OB History    No data available      Review of Systems  Constitutional: Positive for fever and chills.  HENT: Negative for congestion and rhinorrhea.   Eyes: Negative for redness and visual disturbance.  Respiratory: Negative for shortness of breath and wheezing.   Cardiovascular: Negative for chest pain and palpitations.  Gastrointestinal: Positive for abdominal pain. Negative for nausea and vomiting.  Genitourinary: Negative for dysuria and urgency.  Musculoskeletal: Negative for myalgias and arthralgias.  Skin: Negative for pallor and wound.  Neurological: Negative for dizziness and headaches.      Allergies  Review of patient's allergies indicates no known allergies.  Home Medications   Prior to Admission medications   Medication Sig Start Date End Date Taking? Authorizing Provider  acetaminophen (TYLENOL) 500 MG tablet Take 500 mg by mouth every 6 (six) hours as needed for mild pain.   Yes Historical Provider, MD  Acetaminophen-Caff-Pyrilamine (MIDOL COMPLETE PO) Take 1 tablet by mouth 2 (two) times daily as needed (pain).   Yes Historical Provider, MD  doxycycline (VIBRAMYCIN) 100 MG capsule Take 1 capsule (100 mg total) by mouth 2 (two) times daily. 09/20/15  Yes Dione Boozeavid Glick, MD  fluconazole (DIFLUCAN) 150 MG tablet Take 1 tablet (150 mg total) by mouth daily. Take after completing course of antibiotics 09/20/15  Yes Dione Boozeavid Glick, MD  metroNIDAZOLE (FLAGYL) 500 MG tablet Take 1 tablet (500 mg total) by mouth 2 (two) times daily. 09/20/15  Yes Dione Boozeavid Glick, MD  ondansetron (ZOFRAN) 4 MG tablet Take 1 tablet (4  mg total) by mouth every 6 (six) hours as needed for nausea or vomiting. 09/20/15  Yes Dione Booze, MD  oxyCODONE-acetaminophen (PERCOCET) 5-325 MG tablet Take 1 tablet by mouth every 4 (four) hours as needed for moderate pain. 09/20/15  Yes Dione Booze, MD  ondansetron (ZOFRAN ODT) 4 MG disintegrating tablet Take 1 tablet (4 mg total) by mouth every 8 (eight) hours as needed for nausea or vomiting.  09/20/15   Melene Plan, DO   BP 109/72 mmHg  Pulse 88  Temp(Src) 100.1 F (37.8 C) (Oral)  Resp 16  SpO2 98%  LMP 09/12/2015 Physical Exam  Constitutional: She is oriented to person, place, and time. She appears well-developed and well-nourished. No distress.  HENT:  Head: Normocephalic and atraumatic.  Eyes: EOM are normal. Pupils are equal, round, and reactive to light.  Neck: Normal range of motion. Neck supple.  Cardiovascular: Normal rate and regular rhythm.  Exam reveals no gallop and no friction rub.   No murmur heard. Pulmonary/Chest: Effort normal. She has no wheezes. She has no rales.  Abdominal: Soft. She exhibits no distension. There is tenderness (worse to the right upper quadrant, negative Murphy sign). There is no rebound and no guarding.  Musculoskeletal: She exhibits no edema or tenderness.  Neurological: She is alert and oriented to person, place, and time.  Skin: Skin is warm and dry. She is not diaphoretic.  Psychiatric: She has a normal mood and affect. Her behavior is normal.  Nursing note and vitals reviewed.   ED Course  Procedures (including critical care time) Labs Review Labs Reviewed  COMPREHENSIVE METABOLIC PANEL - Abnormal; Notable for the following:    BUN <5 (*)    Calcium 8.8 (*)    Albumin 3.3 (*)    ALT 12 (*)    Total Bilirubin 0.2 (*)    All other components within normal limits  CBC WITH DIFFERENTIAL/PLATELET - Abnormal; Notable for the following:    WBC 11.8 (*)    RBC 3.31 (*)    Hemoglobin 8.9 (*)    HCT 26.1 (*)    Platelets 446 (*)    Neutro Abs 8.6 (*)    All other components within normal limits    Imaging Review Dg Chest 2 View  09/20/2015  CLINICAL DATA:  Chest pain under the right breast. Shortness of breath and fever EXAM: CHEST  2 VIEW COMPARISON:  None. FINDINGS: Normal heart size and mediastinal contours. No acute infiltrate or edema. No effusion or pneumothorax. No acute osseous findings. IMPRESSION: Negative chest.  Electronically Signed   By: Marnee Spring M.D.   On: 09/20/2015 21:53   Ct Abdomen Pelvis W Contrast  09/20/2015  CLINICAL DATA:  Diffuse abdominal pain for 5 days.  Nausea. EXAM: CT ABDOMEN AND PELVIS WITH CONTRAST TECHNIQUE: Multidetector CT imaging of the abdomen and pelvis was performed using the standard protocol following bolus administration of intravenous contrast. CONTRAST:  80mL OMNIPAQUE IOHEXOL 300 MG/ML  SOLN COMPARISON:  None. FINDINGS: Lower chest:  The included lung bases are clear. Liver: Normal in size. Mild periportal edema, may be related to hydration status. Hepatobiliary: Gallbladder physiologically distended, no calcified gallstone. No biliary dilatation. Pancreas: Normal. Spleen: Normal. Adrenal glands: No nodule. Kidneys: Symmetric renal enhancement.  No hydronephrosis. Stomach/Bowel: Lack of enteric contrast and paucity of intra-abdominal fat limits enteric assessment. Stomach physiologically distended. Small bowel loops are fluid-filled in the central abdomen, there is probable mild wall thickening. The terminal ileum is grossly normal. Air and stool throughout  the colon with scattered areas of colonic wall thickening involving the transverse and descending colon. Hyperdense material within the appendix is likely from prior barium administration, normal in caliber. No periappendiceal inflammatory change. Vascular/Lymphatic: No retroperitoneal adenopathy. Abdominal aorta is normal in caliber. Reproductive: Uterus appears normal. Ovaries not confidently identified. Fluid-filled bowel loops in the adnexa. Bladder: Physiologically distended, no wall thickening. Other: There is mesenteric edema. Small amount of free fluid anteriorly in the lower abdomen and pelvis. Small amount of free fluid in the pelvic cul-de-sac. No pneumoperitoneum. No organized fluid collection. Musculoskeletal: There are no acute or suspicious osseous abnormalities. IMPRESSION: 1. Fluid-filled bowel bowel loops  with scattered associated bowel wall thickening involving small and large bowel. In conjunction with mesenteric haziness and small amount of free fluid, suspect diffuse enteritis. This may be infectious or inflammatory. 2. The appendix appears normal. Electronically Signed   By: Rubye Oaks M.D.   On: 09/20/2015 01:05   I have personally reviewed and evaluated these images and lab results as part of my medical decision-making.   EKG Interpretation   Date/Time:  Friday September 20 2015 19:37:20 EST Ventricular Rate:  85 PR Interval:  156 QRS Duration: 64 QT Interval:  336 QTC Calculation: 399 R Axis:   58 Text Interpretation:  Normal sinus rhythm with sinus arrhythmia Normal ECG  No old tracing to compare Confirmed by Oyuki Hogan MD, DANIEL [id number]) on  09/20/2015 9:29:49 PM      MDM   Final diagnoses:  Enteritis    19 yo F with a chief complaint of right upper quadrant abdominal pain. Mild leukocytosis, LFTs unremarkable. Chest x-ray and EKG unremarkable. CT scan performed yesterday with no concerning signs in the right upper quadrant. Doubt Fitzhugh Curtis syndrome at this time. Will give patient Zofran. Discussed concerns for possible esophagitis from doxycycline. Patient will take with plenty of fluids. PCP follow-up.  10:33 PM:  I have discussed the diagnosis/risks/treatment options with the patient and believe the pt to be eligible for discharge home to follow-up with PCP/OB. We also discussed returning to the ED immediately if new or worsening sx occur. We discussed the sx which are most concerning (e.g., sudden worsening pain, fever, inability to tolerate by mouth) that necessitate immediate return. Medications administered to the patient during their visit and any new prescriptions provided to the patient are listed below.  Medications given during this visit Medications  acetaminophen (TYLENOL) tablet 1,000 mg (not administered)  ibuprofen (ADVIL,MOTRIN) tablet 800 mg (not  administered)    New Prescriptions   ONDANSETRON (ZOFRAN ODT) 4 MG DISINTEGRATING TABLET    Take 1 tablet (4 mg total) by mouth every 8 (eight) hours as needed for nausea or vomiting.    The patient appears reasonably screen and/or stabilized for discharge and I doubt any other medical condition or other [facility] requiring further screening, evaluation, or treatment in the ED at this time prior to discharge.      Melene Plan, DO 09/20/15 2234

## 2015-09-20 NOTE — ED Notes (Signed)
Patient returned without CT scan completed, explained CT scan was ordered due to severe pain during pelvic exam. Concerns for cost. Relayed to Dr. Preston FleetingGlick.

## 2015-09-20 NOTE — ED Notes (Signed)
Called dr. Preston Fleetingglick to room for update.

## 2015-09-23 ENCOUNTER — Telehealth (HOSPITAL_COMMUNITY): Payer: Self-pay

## 2015-09-23 NOTE — Telephone Encounter (Signed)
Positive for chlamydia. Treated per protocol. [employer] form faxed. Attempting to contact.  

## 2015-09-24 ENCOUNTER — Telehealth (HOSPITAL_COMMUNITY): Payer: Self-pay

## 2015-10-14 ENCOUNTER — Telehealth (HOSPITAL_COMMUNITY): Payer: Self-pay

## 2015-10-14 NOTE — Telephone Encounter (Signed)
Unable to reach by phone or mail.  Chart closed.   

## 2015-11-15 ENCOUNTER — Encounter (HOSPITAL_COMMUNITY): Payer: Self-pay | Admitting: Emergency Medicine

## 2015-11-15 ENCOUNTER — Emergency Department (INDEPENDENT_AMBULATORY_CARE_PROVIDER_SITE_OTHER)
Admission: EM | Admit: 2015-11-15 | Discharge: 2015-11-15 | Disposition: A | Discharge status: Home / Self Care | Payer: Self-pay | Source: Home / Self Care | Attending: Family Medicine | Admitting: Family Medicine

## 2015-11-15 DIAGNOSIS — K0889 Other specified disorders of teeth and supporting structures: Secondary | ICD-10-CM

## 2015-11-15 MED ORDER — TRAMADOL HCL 50 MG PO TABS: 50.0000 mg | ORAL_TABLET | Freq: Four times a day (QID) | ORAL | Status: DC | PRN

## 2015-11-15 MED ORDER — AMOXICILLIN 500 MG PO CAPS: 500.0000 mg | ORAL_CAPSULE | Freq: Three times a day (TID) | ORAL | Status: DC

## 2015-11-15 NOTE — ED Provider Notes (Signed)
CSN: [id number]     Arrival date & time 11/15/15  1931 History   First MD Initiated Contact with Patient 11/15/15 1959     No chief complaint on file.  (Consider location/radiation/quality/duration/timing/severity/associated sxs/prior Treatment) HPI Comments: 20 year old female, very poor historian, presents with complaints of enlarged lymph nodes in the right submandibular area at the angle of the jaw associated with an abscessed tooth. This tooth apparently is in the upper right third molar area. She has had pain and discomfort with swelling to the lymph node for at least a year. This flareup began today. She states that she can feel you are69 the lymph node better when lying down. She had pain for about 30 minutes prior to coming to the urgent care. She has taken Children's Motrin but that has not helped. She does not take tablets because they do not work fast enough. She states that she believes she should have gone to the emergency department to get a CT scan. I am having a little trouble putting the pieces of this puzzle together. Her history and physical exam do not necessarily explain her concern.   Past Medical History  Diagnosis Date  . PID (pelvic inflammatory disease)    No past surgical history on file. No family history on file. Social History  Substance Use Topics  . Smoking status: Never Smoker   . Smokeless tobacco: Not on file  . Alcohol Use: No   OB History    No data available     Review of Systems  Constitutional: Negative for fever, activity change and fatigue.  HENT: Positive for facial swelling. Negative for congestion, ear pain, postnasal drip, rhinorrhea, sore throat and trouble swallowing.   Eyes: Negative.   Respiratory: Negative.  Negative for cough and shortness of breath.   Skin: Negative.   Neurological: Positive for headaches.    Allergies  Review of patient's allergies indicates no known allergies.  Home Medications   Prior to Admission  medications   Medication Sig Start Date End Date Taking? Authorizing Provider  acetaminophen (TYLENOL) 500 MG tablet Take 500 mg by mouth every 6 (six) hours as needed for mild pain.    Historical Provider, MD  Acetaminophen-Caff-Pyrilamine (MIDOL COMPLETE PO) Take 1 tablet by mouth 2 (two) times daily as needed (pain).    Historical Provider, MD  amoxicillin (AMOXIL) 500 MG capsule Take 1 capsule (500 mg total) by mouth 3 (three) times daily. 11/15/15   Hayden Rasmussenavid Sabre Romberger, NP  doxycycline (VIBRAMYCIN) 100 MG capsule Take 1 capsule (100 mg total) by mouth 2 (two) times daily. 09/20/15   Dione Boozeavid Glick, MD  fluconazole (DIFLUCAN) 150 MG tablet Take 1 tablet (150 mg total) by mouth daily. Take after completing course of antibiotics 09/20/15   Dione Boozeavid Glick, MD  metroNIDAZOLE (FLAGYL) 500 MG tablet Take 1 tablet (500 mg total) by mouth 2 (two) times daily. 09/20/15   Dione Boozeavid Glick, MD  ondansetron (ZOFRAN ODT) 4 MG disintegrating tablet Take 1 tablet (4 mg total) by mouth every 8 (eight) hours as needed for nausea or vomiting. 09/20/15   Melene Planan Floyd, DO  ondansetron (ZOFRAN) 4 MG tablet Take 1 tablet (4 mg total) by mouth every 6 (six) hours as needed for nausea or vomiting. 09/20/15   Dione Boozeavid Glick, MD  oxyCODONE-acetaminophen (PERCOCET) 5-325 MG tablet Take 1 tablet by mouth every 4 (four) hours as needed for moderate pain. 09/20/15   Dione Boozeavid Glick, MD  traMADol (ULTRAM) 50 MG tablet Take 1 tablet (50 mg total) by  mouth every 6 (six) hours as needed. 11/15/15   Hayden Rasmussen, NP   Meds Ordered and Administered this Visit  Medications - No data to display  BP 115/73 mmHg  Pulse 80  Temp(Src) 98.9 F (37.2 C) (Oral)  Resp 16  SpO2 100% No data found.   Physical Exam  Constitutional: She appears well-developed and well-nourished. No distress.  HENT:  Mouth/Throat: Oropharynx is clear and moist.  Oropharynx is clear. No exudates. No swelling. No gingival swelling, erythema or abscess formation is observed. Attempts to  examine the teeth for dental tap is impeded by the patient pulling backward. She states her teeth feel sensitive. The upper right anterior teeth appear not to be tender. Third molar is partially covered with gingiva but no signs of infection. No evidence observed for dental fracture or apparent decay. No other  or intraoral or pharyngeal lesion seen.  Neck: Normal range of motion. Neck supple.  Unable to visualize or palpate any lymph nodes of the anterior neck, submental or submandibular areas. There is tenderness to the right submandibular soft tissue but no masses or nodes are palpated.  Cardiovascular: Normal rate.   Pulmonary/Chest: Effort normal. No respiratory distress.  Lymphadenopathy:    She has no cervical adenopathy.  Neurological: She is alert. She exhibits normal muscle tone.  Skin: Skin is warm and dry.  Nursing note and vitals reviewed.   ED Course  Procedures (including critical care time)  Labs Review Labs Reviewed - No data to display  Imaging Review No results found.   Visual Acuity Review  Right Eye Distance:   Left Eye Distance:   Bilateral Distance:    Right Eye Near:   Left Eye Near:    Bilateral Near:         MDM   1. Pain, dental    Amoxicillin as directed Tramadol for pain Ibuprofen 400 to 600 mg every 6 hours for pain as needed Call dentist as soon as possible for appointment     Hayden Rasmussen, NP 11/15/15 2033

## 2015-11-15 NOTE — Discharge Instructions (Signed)
Amoxicillin as directed Tramadol for pain Ibuprofen 400 to 600 mg every 6 hours for pain as needed Call dentist as soon as possible for appointment

## 2015-11-15 NOTE — ED Notes (Signed)
C/o dental pain onset 2 weeks on right side Denies fevers, chills A&O x4... No acute distress.

## 2016-03-02 ENCOUNTER — Emergency Department (HOSPITAL_COMMUNITY)
Admission: EM | Admit: 2016-03-02 | Discharge: 2016-03-02 | Disposition: A | Discharge status: Home / Self Care | Payer: Self-pay | Attending: Emergency Medicine | Admitting: Emergency Medicine

## 2016-03-02 ENCOUNTER — Encounter (HOSPITAL_COMMUNITY): Payer: Self-pay | Admitting: Emergency Medicine

## 2016-03-02 DIAGNOSIS — Z202 Contact with and (suspected) exposure to infections with a predominantly sexual mode of transmission: Secondary | ICD-10-CM | POA: Insufficient documentation

## 2016-03-02 DIAGNOSIS — Z792 Long term (current) use of antibiotics: Secondary | ICD-10-CM | POA: Insufficient documentation

## 2016-03-02 DIAGNOSIS — J020 Streptococcal pharyngitis: Secondary | ICD-10-CM | POA: Insufficient documentation

## 2016-03-02 DIAGNOSIS — Z3202 Encounter for pregnancy test, result negative: Secondary | ICD-10-CM | POA: Insufficient documentation

## 2016-03-02 DIAGNOSIS — Z79899 Other long term (current) drug therapy: Secondary | ICD-10-CM | POA: Insufficient documentation

## 2016-03-02 DIAGNOSIS — R000 Tachycardia, unspecified: Secondary | ICD-10-CM | POA: Insufficient documentation

## 2016-03-02 DIAGNOSIS — N3000 Acute cystitis without hematuria: Secondary | ICD-10-CM | POA: Insufficient documentation

## 2016-03-02 LAB — URINALYSIS, ROUTINE W REFLEX MICROSCOPIC
Bilirubin Urine: NEGATIVE
Glucose, UA: NEGATIVE mg/dL
Hgb urine dipstick: NEGATIVE
Ketones, ur: NEGATIVE mg/dL
NITRITE: POSITIVE — AB
Protein, ur: NEGATIVE mg/dL
SPECIFIC GRAVITY, URINE: 1.021 (ref 1.005–1.030)
pH: 6 (ref 5.0–8.0)

## 2016-03-02 LAB — COMPREHENSIVE METABOLIC PANEL
ALT: 12 U/L — ABNORMAL LOW (ref 14–54)
ANION GAP: 12 (ref 5–15)
AST: 18 U/L (ref 15–41)
Albumin: 4 g/dL (ref 3.5–5.0)
Alkaline Phosphatase: 77 U/L (ref 38–126)
BUN: 5 mg/dL — AB (ref 6–20)
CHLORIDE: 104 mmol/L (ref 101–111)
CO2: 21 mmol/L — ABNORMAL LOW (ref 22–32)
Calcium: 9.2 mg/dL (ref 8.9–10.3)
Creatinine, Ser: 0.83 mg/dL (ref 0.44–1.00)
Glucose, Bld: 115 mg/dL — ABNORMAL HIGH (ref 65–99)
POTASSIUM: 3.6 mmol/L (ref 3.5–5.1)
Sodium: 137 mmol/L (ref 135–145)
Total Bilirubin: 1.4 mg/dL — ABNORMAL HIGH (ref 0.3–1.2)
Total Protein: 7.4 g/dL (ref 6.5–8.1)

## 2016-03-02 LAB — GC/CHLAMYDIA PROBE AMP (~~LOC~~) NOT AT ARMC
Chlamydia: POSITIVE — AB
NEISSERIA GONORRHEA: POSITIVE — AB

## 2016-03-02 LAB — CBC
HCT: 34.2 % — ABNORMAL LOW (ref 36.0–46.0)
HEMOGLOBIN: 11.9 g/dL — AB (ref 12.0–15.0)
MCH: 28.4 pg (ref 26.0–34.0)
MCHC: 34.8 g/dL (ref 30.0–36.0)
MCV: 81.6 fL (ref 78.0–100.0)
Platelets: 236 10*3/uL (ref 150–400)
RBC: 4.19 MIL/uL (ref 3.87–5.11)
RDW: 14.6 % (ref 11.5–15.5)
WBC: 21.9 10*3/uL — AB (ref 4.0–10.5)

## 2016-03-02 LAB — URINE MICROSCOPIC-ADD ON

## 2016-03-02 LAB — WET PREP, GENITAL
Sperm: NONE SEEN
TRICH WET PREP: NONE SEEN
Yeast Wet Prep HPF POC: NONE SEEN

## 2016-03-02 LAB — RAPID STREP SCREEN (MED CTR MEBANE ONLY): Streptococcus, Group A Screen (Direct): POSITIVE — AB

## 2016-03-02 LAB — I-STAT BETA HCG BLOOD, ED (MC, WL, AP ONLY)

## 2016-03-02 LAB — HIV ANTIBODY (ROUTINE TESTING W REFLEX): HIV SCREEN 4TH GENERATION: NONREACTIVE

## 2016-03-02 LAB — I-STAT CG4 LACTIC ACID, ED: Lactic Acid, Venous: 2.58 mmol/L (ref 0.5–2.0)

## 2016-03-02 LAB — LIPASE, BLOOD: LIPASE: 22 U/L (ref 11–51)

## 2016-03-02 LAB — RPR: RPR: NONREACTIVE

## 2016-03-02 MED ORDER — ACETAMINOPHEN 325 MG PO TABS
650.0000 mg | ORAL_TABLET | Freq: Once | ORAL | Status: AC
Start: 2016-03-02 — End: 2016-03-02
  Administered 2016-03-02: 650 mg via ORAL
  Filled 2016-03-02: qty 2

## 2016-03-02 MED ORDER — AZITHROMYCIN 250 MG PO TABS
1000.0000 mg | ORAL_TABLET | Freq: Once | ORAL | Status: AC
  Administered 2016-03-02: 1000 mg via ORAL
  Filled 2016-03-02: qty 4

## 2016-03-02 MED ORDER — CEPHALEXIN 500 MG PO CAPS: 500.0000 mg | ORAL_CAPSULE | Freq: Three times a day (TID) | ORAL | Status: DC

## 2016-03-02 MED ORDER — ACETAMINOPHEN 325 MG PO TABS
650.0000 mg | ORAL_TABLET | Freq: Once | ORAL | Status: AC
  Administered 2016-03-02: 650 mg via ORAL
  Filled 2016-03-02: qty 2

## 2016-03-02 MED ORDER — CEFTRIAXONE SODIUM 250 MG IJ SOLR: 250.0000 mg | Freq: Once | INTRAMUSCULAR | Status: DC

## 2016-03-02 MED ORDER — DEXTROSE 5 % IV SOLN
1.0000 g | Freq: Once | INTRAVENOUS | Status: AC
  Administered 2016-03-02: 1 g via INTRAVENOUS

## 2016-03-02 MED ORDER — SODIUM CHLORIDE 0.9 % IV BOLUS (SEPSIS)
1000.0000 mL | Freq: Once | INTRAVENOUS | Status: AC
  Administered 2016-03-02: 1000 mL via INTRAVENOUS

## 2016-03-02 MED ORDER — MORPHINE SULFATE (PF) 4 MG/ML IV SOLN
4.0000 mg | Freq: Once | INTRAVENOUS | Status: AC
  Administered 2016-03-02: 4 mg via INTRAVENOUS
  Filled 2016-03-02: qty 1

## 2016-03-02 NOTE — ED Notes (Signed)
Pt seen eating large can of fruit cocktail in the lobby. Tolerating well.

## 2016-03-02 NOTE — Discharge Instructions (Signed)
Please read and follow all provided instructions.  Your diagnoses today include:  1. Acute streptococcal pharyngitis   2. Acute cystitis without hematuria   3. Sexually transmitted disease exposure     Tests performed today include:  Strep test: was POSITIVE for strep throat  Blood counts and electrolytes - elevated white blood cells  Urine test - suggests UTI  Tests for gonorrhea and chlamydia - pending  Vital signs. See below for your results today.   Medications prescribed:   Keflex (cephalexin) - antibiotic  You have been prescribed an antibiotic medicine: take the entire course of medicine even if you are feeling better. Stopping early can cause the antibiotic not to work.  Take any medications prescribed only as directed.   Home care instructions:  Please read the educational materials provided and follow any instructions contained in this packet.  Follow-up instructions: Please follow-up with your primary care provider as needed for further evaluation of your symptoms.  Return instructions:   Please return to the Emergency Department if you experience worsening symptoms.   Return if you have worsening problems swallowing, your neck becomes swollen, you cannot swallow your saliva or your voice becomes muffled.   Return with high persistent fever, persistent vomiting, or if you have trouble breathing.   Please return if you have any other emergent concerns.  Additional Information:  Your vital signs today were: BP 126/73 mmHg   Pulse 118   Temp(Src) 101.9 F (38.8 C) (Oral)   Resp 16   Ht 5\' 5"  (1.651 m)   Wt 58.571 kg   BMI 21.49 kg/m2   SpO2 100%   LMP 02/17/2016 (Exact Date) If your blood pressure (BP) was elevated above 135/85 this visit, please have this repeated by your doctor within one month. --------------

## 2016-03-02 NOTE — ED Notes (Signed)
Pt departed in NAD.  

## 2016-03-02 NOTE — ED Provider Notes (Signed)
CSN: [id number]     Arrival date & time 03/02/16  0141 History   First MD Initiated Contact with Patient 03/02/16 0301     Chief Complaint  Patient presents with  . Sore Throat  . Abdominal Pain  . Fever     (Consider location/radiation/quality/duration/timing/severity/associated sxs/prior Treatment) HPI Comments: Patient with history of STD -- presents with complaint of lower abdominal pain for the past several weeks, vaginal discharge, flank pain, back pain, fever, chills. Patient is concerned that she has a sexually transmitted disease. She also c/o of sore throat. Mother is at home with pharyngitis and bronchitis. Daughter notes that she is sexually active with multiple partners and does not use protection. She participates in oral and vaginal sex. She was taking over-the-counter medications prior to arrival. Abdominal pain has become progressively worse. Fevers and sore throat started in the past 2 days. Onset of symptoms acute. Course is gradually worsening. Nothing makes symptoms better.  Patient is a 20 y.o. female presenting with pharyngitis, abdominal pain, and fever. The history is provided by the patient.  Sore Throat Associated symptoms include abdominal pain and a fever. Pertinent negatives include no arthralgias, rash or sore throat.  Abdominal Pain Associated symptoms: fever and vaginal discharge   Associated symptoms: no dysuria, no sore throat and no vaginal bleeding   Fever Associated symptoms: no dysuria, no rash and no sore throat     Past Medical History  Diagnosis Date  . PID (pelvic inflammatory disease)    History reviewed. No pertinent past surgical history. History reviewed. No pertinent family history. Social History  Substance Use Topics  . Smoking status: Never Smoker   . Smokeless tobacco: None  . Alcohol Use: No   OB History    No data available     Review of Systems  Constitutional: Positive for fever.  HENT: Negative for sore throat.    Eyes: Negative for discharge.  Gastrointestinal: Positive for abdominal pain. Negative for rectal pain.  Genitourinary: Positive for vaginal discharge and pelvic pain. Negative for dysuria, frequency, vaginal bleeding and genital sores.  Musculoskeletal: Negative for arthralgias.  Skin: Negative for rash.  Hematological: Negative for adenopathy.    Allergies  Review of patient's allergies indicates no known allergies.  Home Medications   Prior to Admission medications   Medication Sig Start Date End Date Taking? Authorizing Provider  acetaminophen (TYLENOL) 500 MG tablet Take 500 mg by mouth every 6 (six) hours as needed for mild pain.    Historical Provider, MD  Acetaminophen-Caff-Pyrilamine (MIDOL COMPLETE PO) Take 1 tablet by mouth 2 (two) times daily as needed (pain).    Historical Provider, MD  amoxicillin (AMOXIL) 500 MG capsule Take 1 capsule (500 mg total) by mouth 3 (three) times daily. 11/15/15   Hayden Rasmussen, NP  doxycycline (VIBRAMYCIN) 100 MG capsule Take 1 capsule (100 mg total) by mouth 2 (two) times daily. 09/20/15   Dione Booze, MD  fluconazole (DIFLUCAN) 150 MG tablet Take 1 tablet (150 mg total) by mouth daily. Take after completing course of antibiotics 09/20/15   Dione Booze, MD  metroNIDAZOLE (FLAGYL) 500 MG tablet Take 1 tablet (500 mg total) by mouth 2 (two) times daily. 09/20/15   Dione Booze, MD  ondansetron (ZOFRAN ODT) 4 MG disintegrating tablet Take 1 tablet (4 mg total) by mouth every 8 (eight) hours as needed for nausea or vomiting. 09/20/15   Melene Plan, DO  ondansetron (ZOFRAN) 4 MG tablet Take 1 tablet (4 mg total) by mouth every  6 (six) hours as needed for nausea or vomiting. 09/20/15   Dione Booze, MD  oxyCODONE-acetaminophen (PERCOCET) 5-325 MG tablet Take 1 tablet by mouth every 4 (four) hours as needed for moderate pain. 09/20/15   Dione Booze, MD  traMADol (ULTRAM) 50 MG tablet Take 1 tablet (50 mg total) by mouth every 6 (six) hours as needed. 11/15/15    Hayden Rasmussen, NP   BP 126/73 mmHg  Pulse 118  Temp(Src) 99.5 F (37.5 C) (Oral)  Resp 16  Ht  (1.651 m)  Wt 58.571 kg  BMI 21.49 kg/m2  SpO2 100%  LMP 02/17/2016 (Exact Date)   Physical Exam  Constitutional: She appears well-developed and well-nourished.  HENT:  Head: Normocephalic and atraumatic.  Right Ear: Tympanic membrane, external ear and ear canal normal.  Left Ear: Tympanic membrane, external ear and ear canal normal.  Nose: Nose normal. No mucosal edema or rhinorrhea.  Mouth/Throat: Uvula is midline. Mucous membranes are not dry. No trismus in the jaw. Oropharyngeal exudate, posterior oropharyngeal edema and posterior oropharyngeal erythema present. No tonsillar abscesses.  Eyes: Conjunctivae are normal. Right eye exhibits no discharge. Left eye exhibits no discharge.  Neck: Normal range of motion. Neck supple.  Cardiovascular: Regular rhythm and normal heart sounds.  Tachycardia present.   No murmur heard. Pulmonary/Chest: Effort normal and breath sounds normal. No respiratory distress. She has no wheezes. She has no rales.  Abdominal: Soft. There is tenderness (Mild, suprapubic). There is CVA tenderness. There is no rebound and no guarding.  Genitourinary: There is no rash, tenderness or lesion on the right labia. There is no rash, tenderness or lesion on the left labia. Cervix exhibits discharge (White). Cervix exhibits no motion tenderness and no friability. Right adnexum displays no mass and no tenderness. Left adnexum displays no mass and no tenderness. No tenderness or bleeding in the vagina. Vaginal discharge found.  Neurological: She is alert.  Skin: Skin is warm and dry.  Psychiatric: She has a normal mood and affect.  Nursing note and vitals reviewed.   ED Course  Procedures (including critical care time) Labs Review Labs Reviewed  WET PREP, GENITAL - Abnormal; Notable for the following:    Clue Cells Wet Prep HPF POC PRESENT (*)    WBC, Wet Prep HPF POC  MANY (*)    All other components within normal limits  RAPID STREP SCREEN (NOT AT [facility]) - Abnormal; Notable for the following:    Streptococcus, Group A Screen (Direct) POSITIVE (*)    All other components within normal limits  COMPREHENSIVE METABOLIC PANEL - Abnormal; Notable for the following:    CO2 21 (*)    Glucose, Bld 115 (*)    BUN 5 (*)    ALT 12 (*)    Total Bilirubin 1.4 (*)    All other components within normal limits  CBC - Abnormal; Notable for the following:    WBC 21.9 (*)    Hemoglobin 11.9 (*)    HCT 34.2 (*)    All other components within normal limits  URINALYSIS, ROUTINE W REFLEX MICROSCOPIC (NOT AT [facility]) - Abnormal; Notable for the following:    APPearance CLOUDY (*)    Nitrite POSITIVE (*)    Leukocytes, UA MODERATE (*)    All other components within normal limits  URINE MICROSCOPIC-ADD ON - Abnormal; Notable for the following:    Squamous Epithelial / LPF 6-30 (*)    Bacteria, UA MANY (*)    All other components within normal  limits  I-STAT CG4 LACTIC ACID, ED - Abnormal; Notable for the following:    Lactic Acid, Venous 2.58 (*)    All other components within normal limits  LIPASE, BLOOD  RPR  HIV ANTIBODY (ROUTINE TESTING)  I-STAT BETA HCG BLOOD, ED ([facility], [facility], [facility] ONLY)  GC/CHLAMYDIA PROBE AMP () NOT AT [facility]      3:28 AM Patient seen and examined. Work-up initiated. Medications ordered. Pelvic exam performed with NT chaperone.   Vital signs reviewed and are as follows: BP 126/73 mmHg  Pulse 118  Temp(Src) 99.5 F (37.5 C) (Oral)  Resp 16  Ht 5\' 5"  (1.651 m)  Wt 58.571 kg  BMI 21.49 kg/m2  SpO2 100%  LMP 02/17/2016 (Exact Date)  5:52 AM Strep positive, patient informed. Will d/c to home with keflex for strep pharyngitis and urinary tract infection. Encouraged PCP follow-up for recheck in the next 3-5 days. Return to the emergency department with worsening symptoms, high persistent fever, vomiting, worsening back pain, other  concerns. Patient verbalizes understanding and agrees with plan.  MDM   Final diagnoses:  Acute streptococcal pharyngitis  Acute cystitis without hematuria  Sexually transmitted disease exposure   Sore throat, fever: Likely related to acute streptococcal pharyngitis. Strep test is positive. Mother is at home with pharyngitis as well. Lower suspicion for gonococcal pharyngitis, however patient was treated for GC exposure tonight. No peritonsillar abscess noted.  Acute cystitis: Patient does have some flank pain, no vomiting. Low suspicion for pyelonephritis. Urine is nitrite positive. Will treat with Keflex.  STI exposure: No significant tenderness on exam to suggest pelvic inflammatory disease. Patient treated with azithromycin and Rocephin in emergency department. Given positive strep, feel that PID is much less likely and I do not feel that with such a benign exam, patient is likely to have severe PID or tubo-ovarian abscess. Do not feel that ultrasound is indicated at this time.  Tachycardia: Likely secondary to fever. Patient treated with fluids in the ED.    Renne CriglerJoshua Labresha Mellor, PA-C 03/02/16 [id number]  Laurence Spatesachel Morgan Little, MD 03/02/16 [phone]

## 2016-03-02 NOTE — ED Notes (Signed)
Patient arrives with complaint of abdominal pain, body aches, and sore throat. Onset of abdominal pain was 2 weeks ago and patient states "I think I was exposed to an STD". Endorses vaginal discharge. Sore throat began 2 days ago. Also states fevers at home; 99.5 here.

## 2016-03-03 ENCOUNTER — Telehealth (HOSPITAL_BASED_OUTPATIENT_CLINIC_OR_DEPARTMENT_OTHER): Payer: Self-pay | Admitting: Emergency Medicine

## 2016-03-26 ENCOUNTER — Encounter (HOSPITAL_COMMUNITY): Payer: Self-pay | Admitting: Emergency Medicine

## 2016-03-26 ENCOUNTER — Ambulatory Visit (HOSPITAL_COMMUNITY)
Admission: EM | Admit: 2016-03-26 | Discharge: 2016-03-26 | Disposition: A | Discharge status: Home / Self Care | Payer: Medicaid Other | Attending: Emergency Medicine | Admitting: Emergency Medicine

## 2016-03-26 DIAGNOSIS — N898 Other specified noninflammatory disorders of vagina: Secondary | ICD-10-CM | POA: Insufficient documentation

## 2016-03-26 DIAGNOSIS — Z202 Contact with and (suspected) exposure to infections with a predominantly sexual mode of transmission: Secondary | ICD-10-CM | POA: Insufficient documentation

## 2016-03-26 LAB — POCT PREGNANCY, URINE: PREG TEST UR: NEGATIVE

## 2016-03-26 MED ORDER — CEFTRIAXONE SODIUM 250 MG IJ SOLR
250.0000 mg | Freq: Once | INTRAMUSCULAR | Status: AC
  Administered 2016-03-26: 250 mg via INTRAMUSCULAR

## 2016-03-26 MED ORDER — AZITHROMYCIN 250 MG PO TABS
1000.0000 mg | ORAL_TABLET | Freq: Once | ORAL | Status: AC
  Administered 2016-03-26: 1000 mg via ORAL

## 2016-03-26 MED ORDER — LIDOCAINE HCL (PF) 1 % IJ SOLN
INTRAMUSCULAR | Status: AC
  Filled 2016-03-26: qty 5

## 2016-03-26 MED ORDER — AZITHROMYCIN 250 MG PO TABS
ORAL_TABLET | ORAL | Status: AC
  Filled 2016-03-26: qty 4

## 2016-03-26 MED ORDER — FLUCONAZOLE 150 MG PO TABS: 150.0000 mg | ORAL_TABLET | Freq: Once | ORAL | Status: DC

## 2016-03-26 MED ORDER — CEFTRIAXONE SODIUM 250 MG IJ SOLR
INTRAMUSCULAR | Status: AC
  Filled 2016-03-26: qty 250

## 2016-03-26 NOTE — ED Provider Notes (Signed)
CSN: [id number]     Arrival date & time 03/26/16  1352 History   First MD Initiated Contact with Patient 03/26/16 1533     No chief complaint on file.  (Consider location/radiation/quality/duration/timing/severity/associated sxs/prior Treatment) HPI  She is a 20 year old woman here for evaluation of vaginal discharge. She states a few weeks ago she was seen in the emergency room and treated for STDs as well as strep throat. She states after finishing the antibiotics, she has developed a vaginal discharge and some lower abdominal discomfort. No vaginal itching. She denies any fevers. She does report nausea. She states she did have unprotected sex with her ex recently, where she likely got the STDs. Her period is one week late.  No urinary symptoms.  Past Medical History  Diagnosis Date  . PID (pelvic inflammatory disease)    No past surgical history on file. No family history on file. Social History  Substance Use Topics  . Smoking status: Never Smoker   . Smokeless tobacco: Not on file  . Alcohol Use: No   OB History    No data available     Review of Systems As in history of present illness Allergies  Review of patient's allergies indicates no known allergies.  Home Medications   Prior to Admission medications   Medication Sig Start Date End Date Taking? Authorizing Provider  fluconazole (DIFLUCAN) 150 MG tablet Take 1 tablet (150 mg total) by mouth once. Take 2nd pill if symptoms not completely resolved in 3 days. 03/26/16   Charm RingsErin J Kemiyah Tarazon, MD   Meds Ordered and Administered this Visit   Medications  azithromycin [facility](ZITHROMAX) tablet 1,000 mg (1,000 mg Oral Given 03/26/16 1612)  cefTRIAXone (ROCEPHIN) injection 250 mg (250 mg Intramuscular Given 03/26/16 1613)    BP 130/69 mmHg  Pulse 64  Temp(Src) 98.6 F (37 C) (Oral)  Resp 12  SpO2 100%  LMP 02/17/2016 (Exact Date) No data found.   Physical Exam  Constitutional: She is oriented to person, place, and time. She  appears well-developed and well-nourished. No distress.  Cardiovascular: Normal rate.   Pulmonary/Chest: Effort normal.  Abdominal: Soft. Bowel sounds are normal. She exhibits no distension. There is tenderness (in suprapubic). There is no rebound and no guarding.  Genitourinary:  Pelvic exam deferred.  Neurological: She is alert and oriented to person, place, and time.    ED Course  Procedures (including critical care time)  Labs Review Labs Reviewed  POCT PREGNANCY, URINE  URINE CYTOLOGY ANCILLARY ONLY    Imaging Review No results found.   MDM   1. Vaginal discharge   2. STD exposure    Treated with azithromycin and ceftriaxone. Prescription given for Diflucan to use if she develops vaginal itching. Follow-up as needed.    Charm RingsErin J Gloris Shiroma, MD 03/26/16 [phone]

## 2016-03-26 NOTE — ED Notes (Signed)
Seen by dr honig-abdominal pain for 2 weeks.

## 2016-03-26 NOTE — Discharge Instructions (Signed)
You may been reexposed to gonorrhea and chlamydia. We have sent your urine for testing. We went ahead and retreated you. If you develop a thick white discharge with itching, take the Diflucan. Follow-up as needed.

## 2016-03-27 LAB — URINE CYTOLOGY ANCILLARY ONLY
CHLAMYDIA, DNA PROBE: NEGATIVE
NEISSERIA GONORRHEA: POSITIVE — AB
Trichomonas: NEGATIVE

## 2016-04-02 ENCOUNTER — Telehealth (HOSPITAL_COMMUNITY): Payer: Self-pay | Admitting: Emergency Medicine

## 2016-04-02 NOTE — ED Notes (Signed)
Called [phone] but VM has not been set up.  Need to give lab results from recent visit on 5/18   Per Dr. Dayton ScrapeMurray,  Please let patient and health department know that test for gonorrhea was positive.  [facility] with IM rocephin 250mg  and po zithromax 1g.  Sexual contacts need to be notified and tested/treated.  Tests for chlamydia and trichomonas were negative. Recheck as needed. LM   [facility]

## 2016-04-06 NOTE — ED Notes (Signed)
Called pt and notified of recent lab results from visit 5/18 Pt ID'd properly... Reports feeling better and sx have subsided and has notified partner(s)  Per Dr. Dayton ScrapeMurray,  Please let patient and health department know that test for gonorrhea was positive.  [facility] with IM rocephin 250mg  and po zithromax 1g.  Sexual contacts need to be notified and tested/treated.  Tests for chlamydia and trichomonas were negative. Recheck as needed. LM  Adv pt if sx are not getting better to return  Pt verb understanding Education on safe sex given [facility].

## 2016-07-04 IMAGING — DX DG CHEST 2V
2 series · 2 of 2 positions shown · non-contrast
Comparison: None.

CLINICAL DATA: Chest pain under the right breast. Shortness of
breath and fever

EXAM:
CHEST  2 VIEW

[w chest pa]
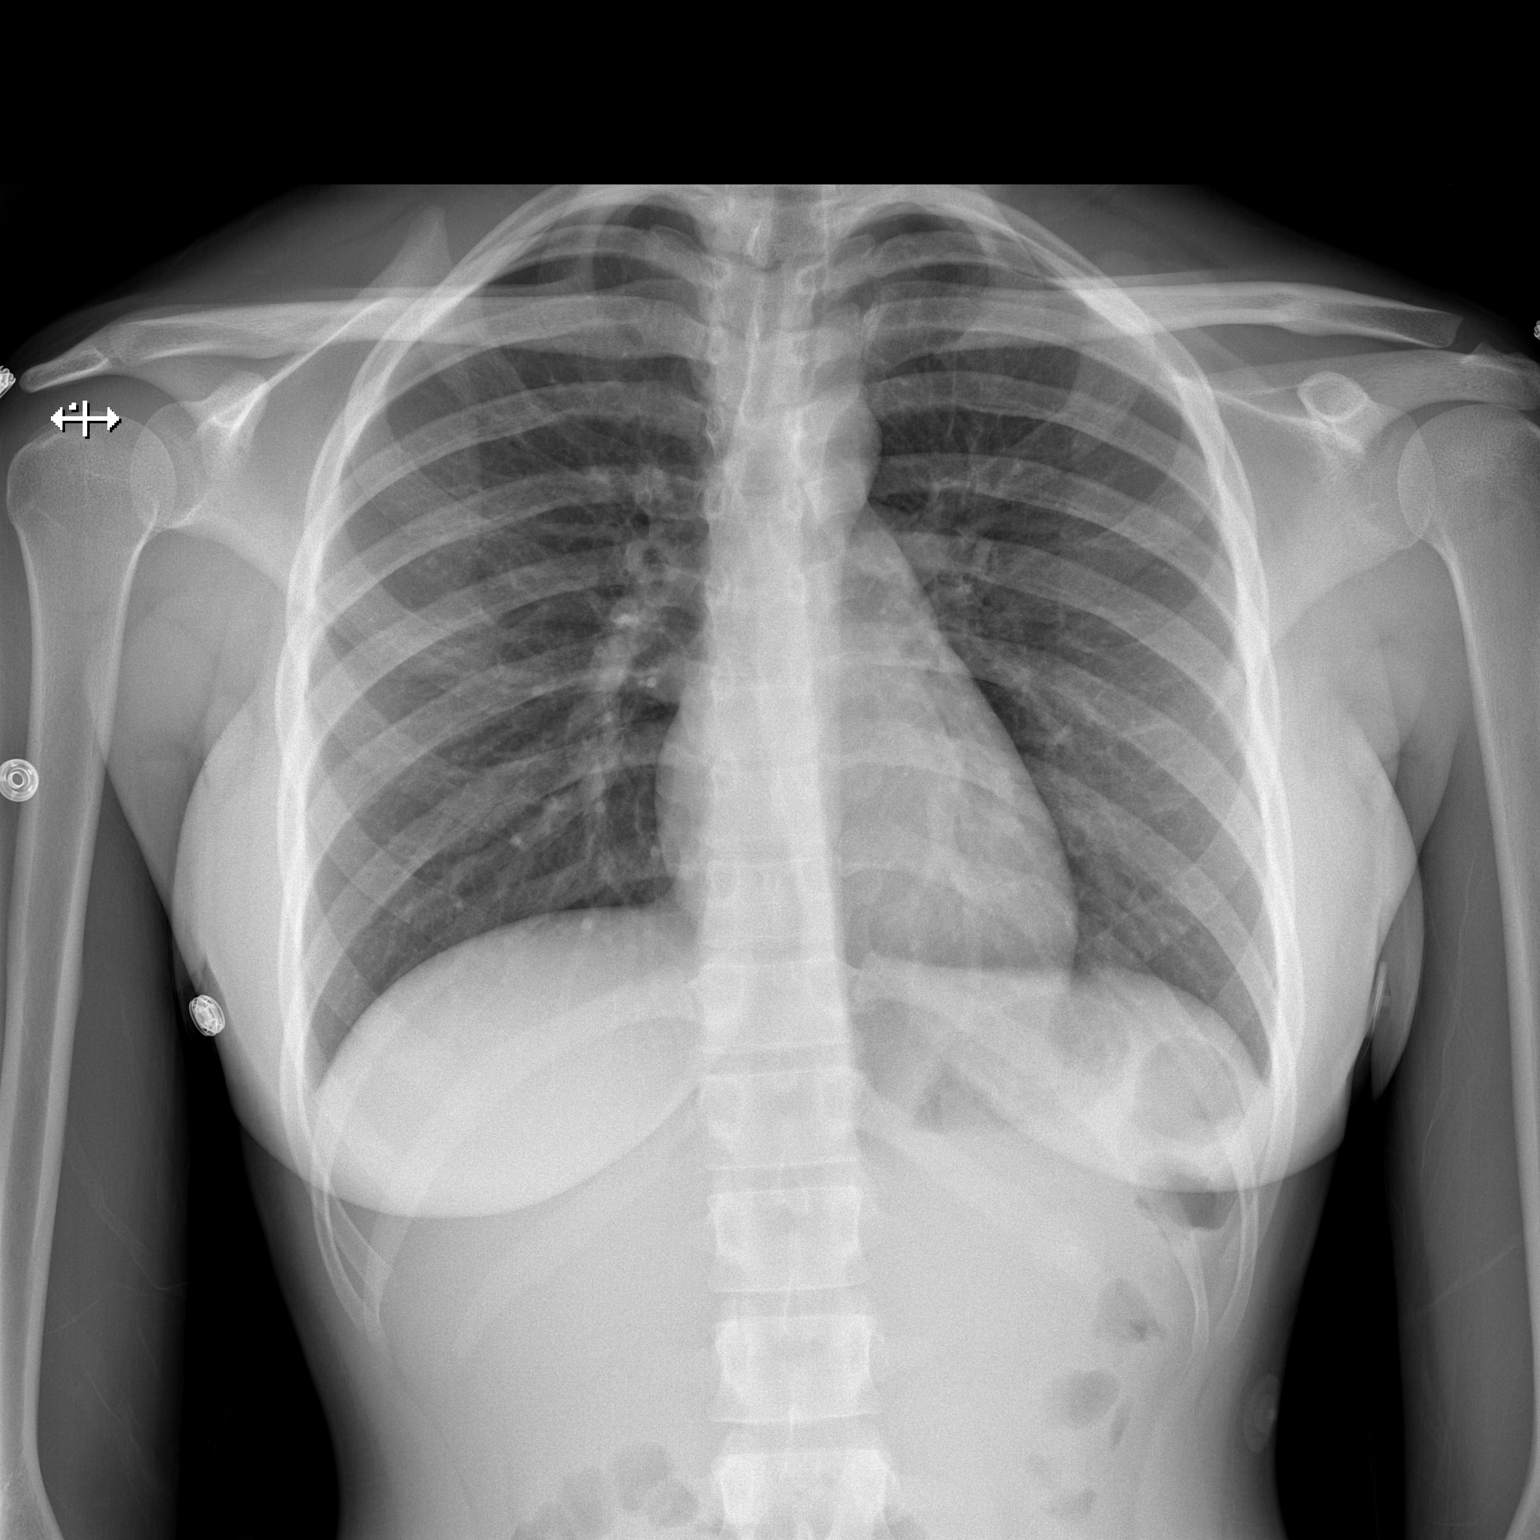

[w chest lat]
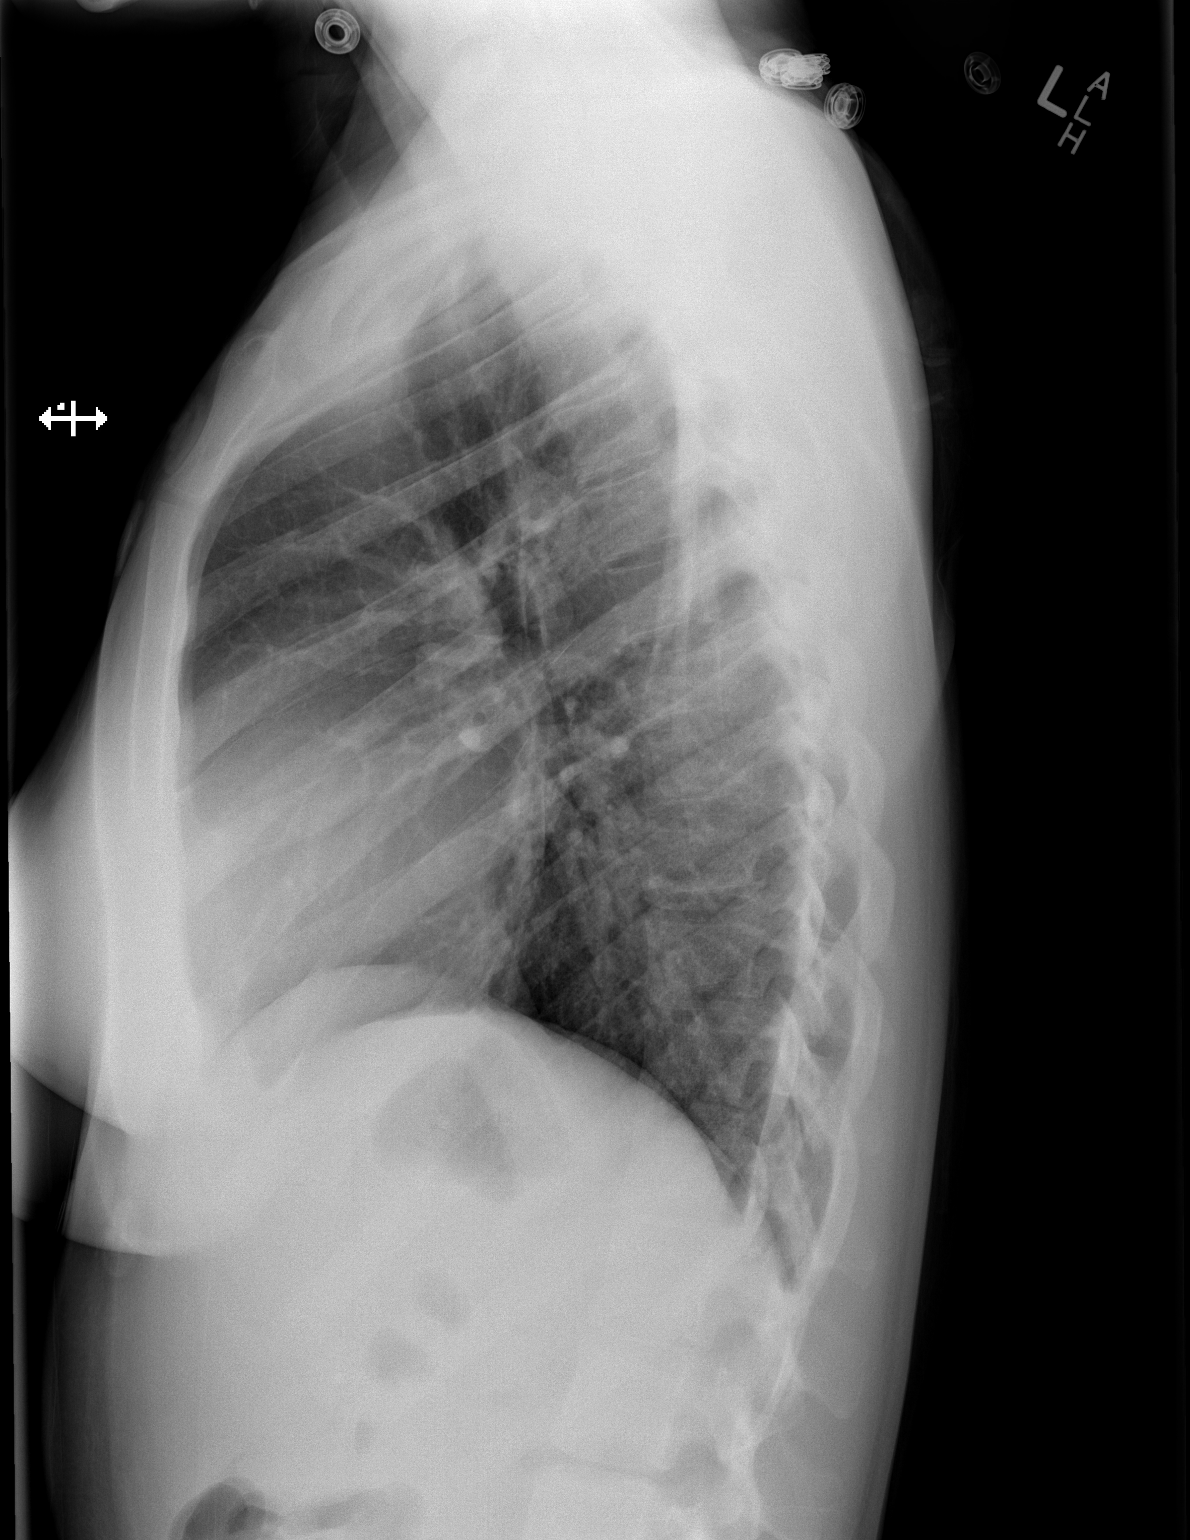

[2 of 2 positions shown; findings below may reference images not displayed]

FINDINGS: Normal heart size and mediastinal contours. No acute infiltrate or
edema. No effusion or pneumothorax. No acute osseous findings.
IMPRESSION: Negative chest.

## 2016-08-19 ENCOUNTER — Ambulatory Visit (HOSPITAL_COMMUNITY)
Admission: EM | Admit: 2016-08-19 | Discharge: 2016-08-19 | Disposition: A | Discharge status: Home / Self Care | Payer: Medicaid Other | Attending: Family Medicine | Admitting: Family Medicine

## 2016-08-19 ENCOUNTER — Encounter (HOSPITAL_COMMUNITY): Payer: Self-pay | Admitting: Emergency Medicine

## 2016-08-19 DIAGNOSIS — B3731 Acute candidiasis of vulva and vagina: Secondary | ICD-10-CM

## 2016-08-19 DIAGNOSIS — R102 Pelvic and perineal pain: Secondary | ICD-10-CM | POA: Insufficient documentation

## 2016-08-19 DIAGNOSIS — Z202 Contact with and (suspected) exposure to infections with a predominantly sexual mode of transmission: Secondary | ICD-10-CM | POA: Insufficient documentation

## 2016-08-19 DIAGNOSIS — R319 Hematuria, unspecified: Secondary | ICD-10-CM

## 2016-08-19 DIAGNOSIS — N390 Urinary tract infection, site not specified: Secondary | ICD-10-CM | POA: Insufficient documentation

## 2016-08-19 DIAGNOSIS — Z79899 Other long term (current) drug therapy: Secondary | ICD-10-CM | POA: Insufficient documentation

## 2016-08-19 DIAGNOSIS — B373 Candidiasis of vulva and vagina: Secondary | ICD-10-CM | POA: Insufficient documentation

## 2016-08-19 DIAGNOSIS — R300 Dysuria: Secondary | ICD-10-CM | POA: Insufficient documentation

## 2016-08-19 LAB — POCT URINALYSIS DIP (DEVICE)
Bilirubin Urine: NEGATIVE
GLUCOSE, UA: NEGATIVE mg/dL
Hgb urine dipstick: NEGATIVE
Ketones, ur: 15 mg/dL — AB
NITRITE: POSITIVE — AB
Protein, ur: NEGATIVE mg/dL
UROBILINOGEN UA: 0.2 mg/dL (ref 0.0–1.0)
pH: 6 (ref 5.0–8.0)

## 2016-08-19 LAB — POCT PREGNANCY, URINE: PREG TEST UR: NEGATIVE

## 2016-08-19 MED ORDER — CEFTRIAXONE SODIUM 250 MG IJ SOLR
INTRAMUSCULAR | Status: AC
  Filled 2016-08-19: qty 250

## 2016-08-19 MED ORDER — CEFTRIAXONE SODIUM 250 MG IJ SOLR
250.0000 mg | Freq: Once | INTRAMUSCULAR | Status: AC
Start: 2016-08-19 — End: 2016-08-19
  Administered 2016-08-19: 250 mg via INTRAMUSCULAR

## 2016-08-19 MED ORDER — AZITHROMYCIN 250 MG PO TABS
1000.0000 mg | ORAL_TABLET | Freq: Once | ORAL | Status: AC
  Administered 2016-08-19: 1000 mg via ORAL

## 2016-08-19 MED ORDER — LIDOCAINE HCL (PF) 1 % IJ SOLN
INTRAMUSCULAR | Status: AC
  Filled 2016-08-19: qty 2

## 2016-08-19 MED ORDER — AZITHROMYCIN 250 MG PO TABS
ORAL_TABLET | ORAL | Status: AC
  Filled 2016-08-19: qty 4

## 2016-08-19 MED ORDER — NITROFURANTOIN MONOHYD MACRO 100 MG PO CAPS: 100.0000 mg | ORAL_CAPSULE | Freq: Two times a day (BID) | ORAL | 0 refills | Status: DC

## 2016-08-19 MED ORDER — FLUCONAZOLE 150 MG PO TABS: 150.0000 mg | ORAL_TABLET | Freq: Once | ORAL | 0 refills | Status: AC

## 2016-08-19 NOTE — ED Triage Notes (Signed)
Reports a one week history of abdominal pain, vaginal discharge, painful intercourse.  Patient reports she has had chlamydia before and thinks this is going on now.

## 2016-08-19 NOTE — ED Provider Notes (Signed)
CSN: [id number]     Arrival date & time 08/19/16  1544 History   None    Chief Complaint  Patient presents with  . Abdominal Pain   (Consider location/radiation/quality/duration/timing/severity/associated sxs/prior Treatment) Patient c/o pelvic pain.  She has pain with intercourse.  She has dysuria.  She reports she is sexually active with a female who gave her chlamydia in the past.  She thinks she may have chlamydia.   The history is provided by the patient.  Abdominal Pain  Pain location:  Suprapubic Pain quality: aching   Pain radiates to:  Does not radiate Pain severity:  Moderate Onset quality:  Gradual Duration:  2 days Timing:  Constant Progression:  Worsening Chronicity:  Recurrent Context: recent sexual activity   Worsened by:  Nothing Ineffective treatments:  None tried Associated symptoms: vaginal discharge     Past Medical History:  Diagnosis Date  . PID (pelvic inflammatory disease)    History reviewed. No pertinent surgical history. No family history on file. Social History  Substance Use Topics  . Smoking status: Never Smoker  . Smokeless tobacco: Not on file  . Alcohol use No   OB History    No data available     Review of Systems  Constitutional: Negative.   HENT: Negative.   Eyes: Negative.   Respiratory: Negative.   Gastrointestinal: Positive for abdominal pain.  Endocrine: Negative.   Genitourinary: Positive for vaginal discharge.  Musculoskeletal: Negative.   Skin: Negative.   Allergic/Immunologic: Negative.   Neurological: Negative.   Hematological: Negative.   Psychiatric/Behavioral: Negative.     Allergies  Review of patient's allergies indicates no known allergies.  Home Medications   Prior to Admission medications   Medication Sig Start Date End Date Taking? Authorizing Provider  fluconazole (DIFLUCAN) 150 MG tablet Take 1 tablet (150 mg total) by mouth once. Take 2nd pill if symptoms not completely resolved in 3  days. Patient not taking: Reported on 08/19/2016 03/26/16   Charm RingsErin J Honig, MD   Meds Ordered and Administered this Visit  Medications - No data to display  BP 110/71 (BP Location: Left Arm)   Pulse 70   Temp 98.6 F (37 C) (Oral)   Resp 16   LMP 07/16/2016   SpO2 97%  No data found.   Physical Exam  Constitutional: She appears well-developed and well-nourished.  HENT:  Head: Normocephalic and atraumatic.  Eyes: Conjunctivae and EOM are normal. Pupils are equal, round, and reactive to light.  Neck: Normal range of motion. Neck supple.  Cardiovascular: Normal rate, regular rhythm and normal heart sounds.   Pulmonary/Chest: Effort normal and breath sounds normal.  Abdominal: Soft. Bowel sounds are normal.  Genitourinary:  Genitourinary Comments: Vagina with white vaginal DC Cervix is nuliparous  Unable to do pelvic exam patient pushes my hand away and the exam is immediately stopped.    Nursing note and vitals reviewed.   Urgent Care Course   Clinical Course    Procedures (including critical care time)  Labs Review Labs Reviewed  POCT URINALYSIS DIP (DEVICE) - Abnormal; Notable for the following:       Result Value   Ketones, ur 15 (*)    Nitrite POSITIVE (*)    Leukocytes, UA TRACE (*)    All other components within normal limits  POCT PREGNANCY, URINE    Imaging Review No results found.   Visual Acuity Review  Right Eye Distance:   Left Eye Distance:   Bilateral Distance:  Right Eye Near:   Left Eye Near:    Bilateral Near:         MDM  UTI - Macrobid 100mg  one po bid x 10 days #20  STD exposure  Pelvic Pain  I was unable to evaluate patient due to her becoming uncomfortable with exam and resisting and therefore exam was stopped.  I did explain to patient that I was unable to evaluate her for PID.  Based upon her hx I will empirically tx for GC and Chlamydia.  Endocervical CX is sent for GC chlamydia, and wet prep.  Advised to use protection if  sexually active.  Vaginal Candidiasis - Diflucan 150mg  one po qd x 2 days #2   Deatra Canter, FNP 08/19/16 1839

## 2016-08-20 LAB — CERVICOVAGINAL ANCILLARY ONLY
Chlamydia: NEGATIVE
Neisseria Gonorrhea: NEGATIVE
Wet Prep (BD Affirm): POSITIVE — AB

## 2016-08-22 LAB — URINE CULTURE: Culture: >=100000 — AB

## 2016-08-24 ENCOUNTER — Telehealth (HOSPITAL_COMMUNITY): Payer: Self-pay | Admitting: Emergency Medicine

## 2016-08-24 NOTE — Telephone Encounter (Signed)
LM on [phone] Need to give lab results and to see how pt is doing from recent visit on 10/11 Notified pt in gen message to call back if not feeling any better, not tolerating meds well or if wanting to know lab results. Also let pt know labs can be obtained from MyChart Need to know if needing Rx sent to pharmacy

## 2016-08-24 NOTE — Telephone Encounter (Signed)
-----   Message from Eustace MooreLaura W Murray, MD sent at 08/22/2016  2:30 PM EDT ----- Please let patient know that urine culture was positive for E coli sensitive to nitrofurantoin [facility].  Finish nitrofurantoin. Test for gardnerella (bacterial vaginosis) was also positive.  Only needs to be treated if patient has persistent sx's of dysuria/vag irritation.   If having persistent sx's of dysuria/vag irritation, would send rx for metronidazole 500mg  bid x 7d #14 no refills.   Recheck or followup with PCP [facility] for further evaluation if symptoms do not resolve.  LM

## 2017-05-21 ENCOUNTER — Emergency Department (HOSPITAL_COMMUNITY)
Admission: EM | Admit: 2017-05-21 | Discharge: 2017-05-22 | Disposition: A | Discharge status: Home / Self Care | Payer: Medicaid Other | Attending: Emergency Medicine | Admitting: Emergency Medicine

## 2017-05-21 ENCOUNTER — Encounter (HOSPITAL_COMMUNITY): Payer: Self-pay

## 2017-05-21 DIAGNOSIS — N10 Acute tubulo-interstitial nephritis: Secondary | ICD-10-CM | POA: Insufficient documentation

## 2017-05-21 DIAGNOSIS — K5909 Other constipation: Secondary | ICD-10-CM | POA: Insufficient documentation

## 2017-05-21 DIAGNOSIS — F17210 Nicotine dependence, cigarettes, uncomplicated: Secondary | ICD-10-CM | POA: Insufficient documentation

## 2017-05-21 DIAGNOSIS — R509 Fever, unspecified: Secondary | ICD-10-CM | POA: Insufficient documentation

## 2017-05-21 LAB — URINALYSIS, ROUTINE W REFLEX MICROSCOPIC
Bilirubin Urine: NEGATIVE
Glucose, UA: NEGATIVE mg/dL
Ketones, ur: NEGATIVE mg/dL
NITRITE: POSITIVE — AB
Protein, ur: 30 mg/dL — AB
SQUAMOUS EPITHELIAL / LPF: NONE SEEN
Specific Gravity, Urine: 1.011 (ref 1.005–1.030)
pH: 5 (ref 5.0–8.0)

## 2017-05-21 NOTE — ED Triage Notes (Signed)
Pt states that she started having fevers today with R sided flank pain, denies urinary symptoms, states some constipation, last BM two days ago.

## 2017-05-21 NOTE — ED Provider Notes (Signed)
[facility] Provider Note   CSN: [id number] Arrival date & time: 05/21/17  2119   By signing my name below, I, Soijett Blue, attest that this documentation has been prepared under the direction and in the presence of Kerrie BuffaloHope Halston Fairclough, NP Electronically Signed: Soijett Blue, ED Scribe. 05/21/17. 12:05 AM.  History   Chief Complaint Chief Complaint  Patient presents with  . Flank Pain  . Fever  . Constipation    HPI Maria Ware is a 21 y.o. female who presents to the Emergency Department complaining of right flank pain onset today. Pt reports associated subjective fever, chills, generalized body aches, urinary urgency, decreased PO intake due to recent dental work, and nausea. Pt has not tried any medications for the relief of her symptoms. Pt reports that she has been sexually active with her current partner x 2 years with unprotected sex and no birth control use. She notes that she has a PMHx of chlamydia that she was treated for. Denies hx of kidney infections. She denies urinary frequency, dysuria, lower abdominal pain, vomiting, ear pain, sore throat, cough, nasal congestion, and any other symptoms.    The history is provided by the patient. No language interpreter was used.  Flank Pain  This is a new problem. The current episode started 6 to 12 hours ago. The problem occurs rarely. The problem has not changed since onset.Pertinent negatives include no chest pain, no headaches and no shortness of breath. Nothing aggravates the symptoms. Nothing relieves the symptoms. She has tried nothing for the symptoms. The treatment provided no relief.    Past Medical History:  Diagnosis Date  . PID (pelvic inflammatory disease)     There are no active problems to display for this patient.   History reviewed. No pertinent surgical history.  OB History    No data available       Home Medications    Prior to Admission medications   Not on File    Family History No  family history on file.  Social History Social History  Substance Use Topics  . Smoking status: Current Every Day Smoker  . Smokeless tobacco: Never Used  . Alcohol use No     Allergies   Patient has no known allergies.   Review of Systems Review of Systems  Constitutional: Positive for appetite change (due to recent dental work), chills and fever.  HENT: Negative.   Respiratory: Negative for shortness of breath.   Cardiovascular: Negative for chest pain.  Gastrointestinal: Positive for nausea.  Genitourinary: Positive for flank pain (right). Negative for frequency.  Musculoskeletal: Positive for back pain and myalgias.  Skin: Negative for rash.  Neurological: Negative for headaches.  Psychiatric/Behavioral: The patient is not nervous/anxious.      Physical Exam Updated Vital Signs BP 114/67   Pulse (!) 104   Temp 99 F (37.2 C)   Resp 20   LMP 05/09/2017   SpO2 100%   Physical Exam  Constitutional: She appears well-developed and well-nourished. No distress.  HENT:  Head: Normocephalic and atraumatic.  Nose: Nose normal.  Mouth/Throat: Mucous membranes are normal.  Eyes: Pupils are equal, round, and reactive to light. EOM are normal.  Sclera clear.   Neck: Neck supple.  FROM of neck without pain. No meningeal signs.   Cardiovascular: Regular rhythm.  Tachycardia present.   Mild tachycardia.  Pulmonary/Chest: Effort normal and breath sounds normal.  Abdominal: Soft. Bowel sounds are normal. There is tenderness. There is CVA tenderness.  Right CVA tenderness.  Suprapubic tenderness. No rebound or guarding.   Genitourinary:  Genitourinary Comments: Nurse chaperone present for exam.  External genitalia without lesions, mucous d/c vaginal vault. No CMT, no adnexal tenderness, uterus not enlarged.  Musculoskeletal: Normal range of motion.  Neurological: She is alert.  Skin: Skin is warm and dry.  Psychiatric: She has a normal mood and affect. Her behavior is  normal.  Nursing note and vitals reviewed.    ED Treatments / Results: labs, IV fluids, toradol IV, Rocephin 1 gram IV, Zofran 4mg  IV   DIAGNOSTIC STUDIES: Oxygen Saturation is 100% on RA, nl by my interpretation.    COORDINATION OF CARE: 11:55 PM Discussed treatment plan with pt at bedside and pt agreed to plan.   Labs (all labs ordered are listed, but only abnormal results are displayed) Labs Reviewed  WET PREP, GENITAL - Abnormal; Notable for the following:       Result Value   Clue Cells Wet Prep HPF POC PRESENT (*)    WBC, Wet Prep HPF POC MANY (*)    All other components within normal limits  URINALYSIS, ROUTINE W REFLEX MICROSCOPIC - Abnormal; Notable for the following:    Hgb urine dipstick SMALL (*)    Protein, ur 30 (*)    Nitrite POSITIVE (*)    Leukocytes, UA MODERATE (*)    Bacteria, UA MANY (*)    All other components within normal limits  URINE CULTURE  CBC WITH DIFFERENTIAL/PLATELET  BASIC METABOLIC PANEL  POC URINE PREG, ED  GC/CHLAMYDIA PROBE AMP ([place]) NOT AT [facility]    Radiology No results found.  Procedures Procedures (including critical care time)  Medications Ordered in ED Medications  0.9 %  sodium chloride infusion ( Intravenous Stopped 05/22/17 0107)  cefTRIAXone (ROCEPHIN) 1 g in dextrose 5 % 50 mL IVPB (0 g Intravenous Stopped 05/22/17 0048)  ondansetron (ZOFRAN) injection 4 mg (4 mg Intravenous Given 05/22/17 0017)  ketorolac (TORADOL) 15 MG/ML injection 15 mg (15 mg Intravenous Given 05/22/17 0115)     Initial Impression / Assessment and Plan / ED Course  I have reviewed the triage vital signs and the nursing notes.  Final Clinical Impressions(s) / ED Diagnoses   Final diagnoses:  Pyelonephritis, acute   Care turned over to Earley Favor, NP @ 0200. Patient with labs pending.   New Prescriptions Current Discharge Medication List     I personally performed the services described in this documentation, which was scribed in my  presence. The recorded information has been reviewed and is accurate.    Kerrie Buffalo [place], [place] 05/22/17 0157    Pricilla Loveless, MD 05/22/17 [id number]

## 2017-05-22 LAB — CBC WITH DIFFERENTIAL/PLATELET
Basophils Absolute: 0 10*3/uL (ref 0.0–0.1)
Basophils Relative: 0 %
EOS PCT: 0 %
Eosinophils Absolute: 0 10*3/uL (ref 0.0–0.7)
HCT: 30.3 % — ABNORMAL LOW (ref 36.0–46.0)
Hemoglobin: 10.8 g/dL — ABNORMAL LOW (ref 12.0–15.0)
LYMPHS ABS: 2.5 10*3/uL (ref 0.7–4.0)
LYMPHS PCT: 16 %
MCH: 29.1 pg (ref 26.0–34.0)
MCHC: 35.6 g/dL (ref 30.0–36.0)
MCV: 81.7 fL (ref 78.0–100.0)
MONO ABS: 0.9 10*3/uL (ref 0.1–1.0)
Monocytes Relative: 6 %
Neutro Abs: 12.2 10*3/uL — ABNORMAL HIGH (ref 1.7–7.7)
Neutrophils Relative %: 78 %
PLATELETS: 228 10*3/uL (ref 150–400)
RBC: 3.71 MIL/uL — AB (ref 3.87–5.11)
RDW: 15.2 % (ref 11.5–15.5)
WBC: 15.7 10*3/uL — AB (ref 4.0–10.5)

## 2017-05-22 LAB — BASIC METABOLIC PANEL
Anion gap: 9 (ref 5–15)
BUN: 12 mg/dL (ref 6–20)
CO2: 21 mmol/L — ABNORMAL LOW (ref 22–32)
Calcium: 8.6 mg/dL — ABNORMAL LOW (ref 8.9–10.3)
Chloride: 107 mmol/L (ref 101–111)
Creatinine, Ser: 0.82 mg/dL (ref 0.44–1.00)
GFR calc Af Amer: >60 mL/min (ref >60)
GFR calc non Af Amer: >60 mL/min (ref >60)
Glucose, Bld: 98 mg/dL (ref 65–99)
Potassium: 4.1 mmol/L (ref 3.5–5.1)
Sodium: 137 mmol/L (ref 135–145)

## 2017-05-22 LAB — WET PREP, GENITAL
Sperm: NONE SEEN
TRICH WET PREP: NONE SEEN
YEAST WET PREP: NONE SEEN

## 2017-05-22 MED ORDER — ONDANSETRON HCL 4 MG/2ML IJ SOLN
4.0000 mg | Freq: Once | INTRAMUSCULAR | Status: AC
  Administered 2017-05-22: 4 mg via INTRAVENOUS
  Filled 2017-05-22: qty 2

## 2017-05-22 MED ORDER — SODIUM CHLORIDE 0.9 % IV SOLN
Freq: Once | INTRAVENOUS | Status: AC
  Administered 2017-05-22: via INTRAVENOUS

## 2017-05-22 MED ORDER — CIPROFLOXACIN HCL 500 MG PO TABS: 500.0000 mg | ORAL_TABLET | Freq: Two times a day (BID) | ORAL | 0 refills | Status: DC

## 2017-05-22 MED ORDER — KETOROLAC TROMETHAMINE 15 MG/ML IJ SOLN
15.0000 mg | Freq: Once | INTRAMUSCULAR | Status: AC
  Administered 2017-05-22: 15 mg via INTRAVENOUS
  Filled 2017-05-22: qty 1

## 2017-05-22 MED ORDER — HYDROCODONE-ACETAMINOPHEN 5-325 MG PO TABS: 1.0000 | ORAL_TABLET | Freq: Four times a day (QID) | ORAL | 0 refills | Status: DC | PRN

## 2017-05-22 MED ORDER — DEXTROSE 5 % IV SOLN
1.0000 g | Freq: Once | INTRAVENOUS | Status: AC
  Administered 2017-05-22: 1 g via INTRAVENOUS
  Filled 2017-05-22: qty 10

## 2017-05-22 NOTE — Discharge Instructions (Signed)
You have a severe urinary tract infection that is now affecting his kidney.  This condition is called pyelonephritis.  Your white, Slightly elevated.  Your kidney function is normal.  You're being treated with antibiotics, Cipro.  Please take this as directed until all tablets have been completed.  You've also been given a prescription for Vicodin for pain control.  You can safely take Tylenol or ibuprofen as well for the severe pain.  Please follow-up with your primary care physician on Monday.  If we can you develop worsening pain, fever, nausea, vomiting, please return for further evaluation and possible admission

## 2017-05-24 LAB — URINE CULTURE

## 2017-05-24 LAB — GC/CHLAMYDIA PROBE AMP (~~LOC~~) NOT AT ARMC
Chlamydia: NEGATIVE
Neisseria Gonorrhea: NEGATIVE

## 2017-05-25 ENCOUNTER — Telehealth: Payer: Self-pay | Admitting: *Deleted

## 2017-05-25 NOTE — Telephone Encounter (Signed)
Post ED Visit - Positive Culture Follow-up  Culture report reviewed by antimicrobial stewardship pharmacist:  []  Enzo BiNathan Batchelder, Pharm.D. []  Celedonio MiyamotoJeremy Frens, Pharm.D., BCPS AQ-ID []  Garvin FilaMike Maccia, Pharm.D., BCPS [x]  Georgina PillionElizabeth Martin, [address]Ph[address]D., BCPS []  BuffaloMinh Pham, [address]Ph[address]D., BCPS, AAHIVP []  Estella HuskMichelle Turner, Pharm.D., BCPS, AAHIVP []  Lysle Pearlachel Rumbarger, PharmD, BCPS []  Casilda Carlsaylor Stone, PharmD, BCPS []  Pollyann SamplesAndy Johnston, PharmD, BCPS  Positive urine culture Treated with Ciprofloxacin HCL, organism sensitive to the same and no further patient follow-up is required at this time.  Virl AxeRobertson, Yarlin [facility]isch [facility] 05/25/2017, 11:12 AM

## 2017-10-15 ENCOUNTER — Ambulatory Visit (HOSPITAL_COMMUNITY)
Admission: EM | Admit: 2017-10-15 | Discharge: 2017-10-15 | Disposition: A | Discharge status: Home / Self Care | Payer: Self-pay | Attending: Internal Medicine | Admitting: Internal Medicine

## 2017-10-15 ENCOUNTER — Encounter (HOSPITAL_COMMUNITY): Payer: Self-pay | Admitting: Family Medicine

## 2017-10-15 DIAGNOSIS — K0889 Other specified disorders of teeth and supporting structures: Secondary | ICD-10-CM

## 2017-10-15 MED ORDER — HYDROCODONE-ACETAMINOPHEN 5-325 MG PO TABS: 1.0000 | ORAL_TABLET | Freq: Four times a day (QID) | ORAL | 0 refills | Status: AC | PRN

## 2017-10-15 MED ORDER — AMOXICILLIN-POT CLAVULANATE 500-125 MG PO TABS: 1.0000 | ORAL_TABLET | Freq: Three times a day (TID) | ORAL | 0 refills | Status: AC

## 2017-10-15 NOTE — ED Triage Notes (Signed)
Pt here for dental pain 

## 2017-10-15 NOTE — ED Provider Notes (Signed)
[facility]    CSN: [id number] Arrival date & time: 10/15/17  1536     History   Chief Complaint Chief Complaint  Patient presents with  . Dental Pain    HPI Maria Ware is a 21 y.o. female presenting with dental pain. Patient states pain has been occurring for years, but has significantly increased recently. She is having difficulty eating, opening mouth and talking because pain is so bad. The cold weather is making pain worse. She has been evaluated by a family dental office that said she needed 12 teeth pulled. Procedure was going to be too expensive so she has not had them removed. No fever, no facial swelling. Taking ibuprofen 800 mg for pain without relief.   HPI  Past Medical History:  Diagnosis Date  . PID (pelvic inflammatory disease)     There are no active problems to display for this patient.   History reviewed. No pertinent surgical history.  OB History    No data available       Home Medications    Prior to Admission medications   Medication Sig Start Date End Date Taking? Authorizing Provider  amoxicillin-clavulanate (AUGMENTIN) 500-125 MG tablet Take 1 tablet (500 mg total) by mouth every 8 (eight) hours for 7 days. 10/15/17 10/22/17  Wieters, Hallie C, PA-C  ciprofloxacin (CIPRO) 500 MG tablet Take 1 tablet (500 mg total) by mouth every 12 (twelve) hours. 05/22/17   Earley FavorSchulz, Gail, NP  HYDROcodone-acetaminophen (NORCO/VICODIN) 5-325 MG tablet Take 1 tablet by mouth every 6 (six) hours as needed for up to 2 days for severe pain. 10/15/17 10/17/17  Wieters, Junius CreamerHallie C, PA-C    Family History History reviewed. No pertinent family history.  Social History Social History   Tobacco Use  . Smoking status: Current Every Day Smoker  . Smokeless tobacco: Never Used  Substance Use Topics  . Alcohol use: No  . Drug use: No     Allergies   Patient has no known allergies.   Review of Systems Review of Systems  Constitutional: Negative  for chills and fever.  HENT: Positive for dental problem. Negative for ear pain, facial swelling and sore throat.   Eyes: Negative for pain and visual disturbance.  Respiratory: Negative for cough and shortness of breath.   Cardiovascular: Negative for chest pain and palpitations.  Gastrointestinal: Negative for abdominal pain, nausea and vomiting.  Genitourinary: Negative for dysuria and hematuria.  Musculoskeletal: Negative for arthralgias and back pain.  Skin: Negative for color change and rash.  Neurological: Positive for headaches. Negative for dizziness and light-headedness.  All other systems reviewed and are negative.    Physical Exam Triage Vital Signs ED Triage Vitals [10/15/17 1545]  Enc Vitals Group     BP 109/69     Pulse Rate (!) 113     Resp 18     Temp 98.4 F (36.9 C)     Temp src      SpO2 98 %     Weight      Height      Head Circumference      Peak Flow      Pain Score 10     Pain Loc      Pain Edu?      Excl. in GC?    No data found.  Updated Vital Signs BP 109/69   Pulse (!) 113   Temp 98.4 F (36.9 C)   Resp 18   SpO2 98%  Physical Exam  Constitutional: She appears well-developed and well-nourished.  Patient is emotional and crying due to pain  HENT:  Head: Normocephalic and atraumatic.  Mouth/Throat: Oropharynx is clear and moist and mucous membranes are normal. No oral lesions. No trismus in the jaw. Abnormal dentition. Dental caries present. No uvula swelling. No posterior oropharyngeal erythema.    Poor dentition through most teeth; one tooth missing; No evidence of abscess.   Eyes: Conjunctivae are normal.  Neck: Normal range of motion. Neck supple.  Cardiovascular: Regular rhythm.  No murmur heard. tachycardic  Pulmonary/Chest: Effort normal and breath sounds normal. No respiratory distress.  Abdominal: Soft. There is no tenderness.  Musculoskeletal: She exhibits no edema.  Neurological: She is alert.  Skin: Skin is warm  and dry.  Psychiatric: She has a normal mood and affect.  Nursing note and vitals reviewed.    UC Treatments / Results  Labs (all labs ordered are listed, but only abnormal results are displayed) Labs Reviewed - No data to display  EKG  EKG Interpretation None       Radiology No results found.  Procedures Procedures (including critical care time)  Medications Ordered in UC Medications - No data to display   Initial Impression / Assessment and Plan / UC Course  I have reviewed the triage vital signs and the nursing notes.  Pertinent labs & imaging results that were available during my care of the patient were reviewed by me and considered in my medical decision making (see chart for details).     Patient experiencing significant dental pain from multiple teeth. Tachycardia likely from pain. Augmentin given for infection, Norco for 2 days given- 8 supplied. [place] registry checked. Advised to continue with Ibuprofen 800 mg every 8 hours with food, may supplement with Tylenol 500-100 mg every 8 hours.   Discussed return precautions to include fever, facial swelling, facial numbness or tingling. Patient verbalized understanding and is agreeable with plan.  Dental Resources provided for uninsured/low income.   Final Clinical Impressions(s) / UC Diagnoses   Final diagnoses:  Tooth pain    ED Discharge Orders        Ordered    amoxicillin-clavulanate (AUGMENTIN) 500-125 MG tablet  Every 8 hours     10/15/17 1600    HYDROcodone-acetaminophen (NORCO/VICODIN) 5-325 MG tablet  Every 6 hours PRN     10/15/17 1600       Controlled Substance Prescriptions [place] Controlled Substance Registry consulted? Yes, I have consulted the [place] Controlled Substances Registry for this patient, and feel the risk/benefit ratio today is favorable for proceeding with this prescription for a controlled substance.  4 lifetime prescriptions. Most recent in July for 6 days.     Lew DawesWieters, Hallie C,  PA-C 10/15/17 1620    Lew DawesWieters, Hallie C, PA-C 10/15/17 1621

## 2017-10-15 NOTE — Discharge Instructions (Signed)
Please take Augmentin (antibiotic) for infection in your tooth.  Continue to take Ibuprofen 800 mg with food every 8 hours; may take Tylenol 500-1000mg  with Ibuprofen, use for mild-moderate pain. Norco sent 2 days worth, use for severe pain.  Please use dental resource guide to see if you can find a cheaper option for having teeth removed.

## 2018-12-30 ENCOUNTER — Encounter (HOSPITAL_COMMUNITY): Payer: Self-pay | Admitting: Emergency Medicine

## 2018-12-30 ENCOUNTER — Ambulatory Visit (HOSPITAL_COMMUNITY)
Admission: EM | Admit: 2018-12-30 | Discharge: 2018-12-30 | Disposition: A | Discharge status: Home / Self Care | Payer: BC Managed Care – PPO | Attending: Family Medicine | Admitting: Family Medicine

## 2018-12-30 DIAGNOSIS — N898 Other specified noninflammatory disorders of vagina: Secondary | ICD-10-CM

## 2018-12-30 DIAGNOSIS — K645 Perianal venous thrombosis: Secondary | ICD-10-CM | POA: Insufficient documentation

## 2018-12-30 MED ORDER — BENZOCAINE 7.5 % MT GEL: OROMUCOSAL | 0 refills | Status: DC

## 2018-12-30 MED ORDER — LIDOCAINE-EPINEPHRINE (PF) 2 %-1:200000 IJ SOLN
INTRAMUSCULAR | Status: AC
  Filled 2018-12-30: qty 20

## 2018-12-30 MED ORDER — HYDROCORTISONE 2.5 % RE CREA: 1.0000 "application " | TOPICAL_CREAM | Freq: Two times a day (BID) | RECTAL | 0 refills | Status: DC

## 2018-12-30 NOTE — ED Provider Notes (Signed)
[facility]    CSN: [id number] Arrival date & time: 12/30/18  1833     History   Chief Complaint Chief Complaint  Patient presents with  . Hemorrhoids  . Exposure to STD    HPI Maria Ware is a 23 y.o. female.   23 year old female comes in for multiple complaints.  1 week history of painful hemorrhoid. States has known hemorrhoid, has been using otc medicine without relief. Has had increased constipation. Mild abdominal pain without nausea, vomiting. Denies fever, chills, night sweats. Denies melena, hematochezia. Denies urinary symptoms such as frequency, dysuria, hematuria.   Patient would like STD testing. States was positive for chlamydia, had intercourse prior to 7 days and would like recheck. Mild vaginal discharge without itching, pain.     Past Medical History:  Diagnosis Date  . PID (pelvic inflammatory disease)     There are no active problems to display for this patient.   History reviewed. No pertinent surgical history.  OB History   No obstetric history on file.      Home Medications    Prior to Admission medications   Not on File    Family History History reviewed. No pertinent family history.  Social History Social History   Tobacco Use  . Smoking status: Current Every Day Smoker  . Smokeless tobacco: Never Used  Substance Use Topics  . Alcohol use: No  . Drug use: No     Allergies   Patient has no known allergies.   Review of Systems Review of Systems  Reason unable to perform ROS: See HPI as above.     Physical Exam Triage Vital Signs ED Triage Vitals [12/30/18 1912]  Enc Vitals Group     BP 113/70     Pulse Rate 87     Resp 18     Temp 98.6 F (37 C)     Temp Source Oral     SpO2 95 %     Weight      Height      Head Circumference      Peak Flow      Pain Score 8     Pain Loc      Pain Edu?      Excl. in GC?    No data found.  Updated Vital Signs BP 113/70 (BP Location: Right Arm)    Pulse 87   Temp 98.6 F (37 C) (Oral)   Resp 18   SpO2 95%   Physical Exam Exam conducted with a chaperone present.  Constitutional:      Appearance: She is well-developed.     Comments: Patient tearful but nontoxic in appearance, without acute distress.   HENT:     Head: Normocephalic and atraumatic.  Eyes:     Conjunctiva/sclera: Conjunctivae normal.     Pupils: Pupils are equal, round, and reactive to light.  Cardiovascular:     Rate and Rhythm: Normal rate and regular rhythm.     Heart sounds: Normal heart sounds. No murmur. No friction rub. No gallop.   Pulmonary:     Effort: Pulmonary effort is normal.     Breath sounds: Normal breath sounds. No wheezing or rales.  Abdominal:     General: Bowel sounds are normal.     Palpations: Abdomen is soft.     Tenderness: There is no abdominal tenderness. There is no guarding or rebound.  Genitourinary:    Exam position: Knee-chest position.     Comments:  Thrombosed external hemorrhoid. No cellulitis, abscess. Unable to perform digital exam due to patient cooperation.  Skin:    General: Skin is warm and dry.  Neurological:     Mental Status: She is alert and oriented to person, place, and time.  Psychiatric:        Behavior: Behavior normal.        Judgment: Judgment normal.      UC Treatments / Results  Labs (all labs ordered are listed, but only abnormal results are displayed) Labs Reviewed  CERVICOVAGINAL ANCILLARY ONLY    EKG None  Radiology No results found.  Procedures Incision and Drainage Date/Time: 12/30/2018 8:50 PM Performed by: Belinda Fisher, PA-C Authorized by: Eustace Moore, MD   Consent:    Consent obtained:  Verbal   Consent given by:  Patient   Risks discussed:  Infection, bleeding, damage to other organs, incomplete drainage and pain   Alternatives discussed:  Alternative treatment and referral Location:    Type:  External thrombosed hemorrhoid   Size:  0.5cm x 0.3cm   Location:   Anogenital   Anogenital location:  Perirectal Pre-procedure details:    Skin preparation:  Chloraprep Anesthesia (see MAR for exact dosages):    Anesthesia method:  Local infiltration   Local anesthetic:  Lidocaine 2% WITH epi Procedure type:    Complexity:  Simple Procedure details:    Incision types:  Single straight   Incision depth:  Dermal   Scalpel blade:  11   Wound management:  Probed and deloculated   Drainage characteristics: bloot clot, blood.   Drainage amount:  Moderate   Wound treatment:  Wound left open   Packing materials:  None Post-procedure details:    Patient tolerance of procedure:  Tolerated well, no immediate complications   (including critical care time)  Medications Ordered in UC Medications - No data to display  Initial Impression / Assessment and Plan / UC Course  I have reviewed the triage vital signs and the nursing notes.  Pertinent labs & imaging results that were available during my care of the patient were reviewed by me and considered in my medical decision making (see chart for details).    Patient tolerated procedure well.Wound care instructions given. Cytology sent. Return precautions given. Patient expresses understanding and agrees to plan.   Final Clinical Impressions(s) / UC Diagnoses   Final diagnoses:  Thrombosed external hemorrhoid  Vaginal discharge    ED Prescriptions    Medication Sig Dispense Auth. Provider   hydrocortisone (ANUSOL-HC) 2.5 % rectal cream  (Status: Discontinued) Place 1 application rectally 2 (two) times daily. 30 g Nahum Sherrer V, PA-C   benzocaine (BABY ORAJEL) 7.5 % oral gel  (Status: Discontinued) Apply to affected area 2-3 times a day as needed. 9.45 g Threasa Alpha, [place] 12/30/18 2051

## 2018-12-30 NOTE — ED Triage Notes (Signed)
Pt sts rectal pain and STD testing

## 2018-12-30 NOTE — Discharge Instructions (Addendum)
Thrombosed hemorrhoid drained today. Keep wound clean and dry. As discussed, it may still have some bloody drainage until it heals. Start miralax to help with constipation. Change dressings every time you move your bowels,   Cytology sent, you will be contacted with any positive results that requires further treatment. Refrain from sexual activity for the next 7 days. Monitor for any worsening of symptoms, fever, abdominal pain, nausea, vomiting, to follow up for reevaluation.

## 2019-01-02 LAB — CERVICOVAGINAL ANCILLARY ONLY
CHLAMYDIA, DNA PROBE: POSITIVE — AB
Neisseria Gonorrhea: NEGATIVE
Trichomonas: NEGATIVE

## 2019-01-05 ENCOUNTER — Telehealth (HOSPITAL_COMMUNITY): Payer: Self-pay | Admitting: Emergency Medicine

## 2019-01-05 MED ORDER — AZITHROMYCIN 250 MG PO TABS: 1000.0000 mg | ORAL_TABLET | Freq: Once | ORAL | 0 refills | Status: AC

## 2019-01-05 NOTE — Telephone Encounter (Signed)
Chlamydia is positive.  This was treated at the urgent care visit with po zithromax 1g.  Pt needs education to please refrain from sexual intercourse for 7 days to give the medicine time to work.  Sexual partners need to be notified and tested/treated.  Condoms may reduce risk of reinfection.  Recheck or followup with PCP for further evaluation if symptoms are not improving.  [facility] notified.  Patient contacted and made aware of all results, all questions answered.  Sending in medicine to [facility] on [address].

## 2019-10-02 ENCOUNTER — Other Ambulatory Visit: Payer: Self-pay

## 2019-10-02 ENCOUNTER — Ambulatory Visit (HOSPITAL_COMMUNITY)
Admission: EM | Admit: 2019-10-02 | Discharge: 2019-10-02 | Disposition: A | Discharge status: Home / Self Care | Payer: Medicaid Other | Attending: Family Medicine | Admitting: Family Medicine

## 2019-10-02 ENCOUNTER — Encounter (HOSPITAL_COMMUNITY): Payer: Self-pay

## 2019-10-02 DIAGNOSIS — N3090 Cystitis, unspecified without hematuria: Secondary | ICD-10-CM | POA: Insufficient documentation

## 2019-10-02 DIAGNOSIS — Z113 Encounter for screening for infections with a predominantly sexual mode of transmission: Secondary | ICD-10-CM | POA: Diagnosis not present

## 2019-10-02 DIAGNOSIS — Z3202 Encounter for pregnancy test, result negative: Secondary | ICD-10-CM

## 2019-10-02 DIAGNOSIS — B9689 Other specified bacterial agents as the cause of diseases classified elsewhere: Secondary | ICD-10-CM

## 2019-10-02 DIAGNOSIS — Z711 Person with feared health complaint in whom no diagnosis is made: Secondary | ICD-10-CM | POA: Diagnosis present

## 2019-10-02 DIAGNOSIS — N898 Other specified noninflammatory disorders of vagina: Secondary | ICD-10-CM | POA: Diagnosis present

## 2019-10-02 LAB — POCT URINALYSIS DIP (DEVICE)
Bilirubin Urine: NEGATIVE
Glucose, UA: NEGATIVE mg/dL
Hgb urine dipstick: NEGATIVE
Ketones, ur: NEGATIVE mg/dL
Leukocytes,Ua: NEGATIVE
Nitrite: POSITIVE — AB
Protein, ur: NEGATIVE mg/dL
Specific Gravity, Urine: 1.02 (ref 1.005–1.030)
Urobilinogen, UA: 0.2 mg/dL (ref 0.0–1.0)
pH: 7 (ref 5.0–8.0)

## 2019-10-02 LAB — POC URINE PREG, ED: Preg Test, Ur: NEGATIVE

## 2019-10-02 LAB — POCT PREGNANCY, URINE: Preg Test, Ur: NEGATIVE

## 2019-10-02 MED ORDER — AZITHROMYCIN 250 MG PO TABS
ORAL_TABLET | ORAL | Status: AC
  Filled 2019-10-02: qty 4

## 2019-10-02 MED ORDER — AZITHROMYCIN 250 MG PO TABS
1000.0000 mg | ORAL_TABLET | Freq: Once | ORAL | Status: AC
  Administered 2019-10-02: 1000 mg via ORAL

## 2019-10-02 MED ORDER — CEFTRIAXONE SODIUM 250 MG IJ SOLR
INTRAMUSCULAR | Status: AC
  Filled 2019-10-02: qty 250

## 2019-10-02 MED ORDER — LIDOCAINE HCL (PF) 1 % IJ SOLN
INTRAMUSCULAR | Status: AC
  Filled 2019-10-02: qty 2

## 2019-10-02 MED ORDER — CEFTRIAXONE SODIUM 250 MG IJ SOLR
250.0000 mg | Freq: Once | INTRAMUSCULAR | Status: AC
  Administered 2019-10-02: 13:00:00 250 mg via INTRAMUSCULAR

## 2019-10-02 MED ORDER — CEPHALEXIN 500 MG PO CAPS: 500.0000 mg | ORAL_CAPSULE | Freq: Two times a day (BID) | ORAL | 0 refills | Status: DC

## 2019-10-02 NOTE — ED Triage Notes (Signed)
Pt states she would like to get tested for STD's. Pt states she has been having abdominal pain 2 weeks or more.

## 2019-10-02 NOTE — Discharge Instructions (Signed)

## 2019-10-04 ENCOUNTER — Telehealth (HOSPITAL_COMMUNITY): Payer: Self-pay | Admitting: Emergency Medicine

## 2019-10-04 LAB — URINE CULTURE: Culture: >=100000 — AB

## 2019-10-04 NOTE — Telephone Encounter (Signed)
Urine culture was positive for ESCHERICHIA COLI and was given keflex  at urgent care visit. There is a possibility that this antibiotic may not work the best due to intermediate resistance. Attempted to reach patient. No answer at this time. Voicemail left.  If she returns call and states she is still symptomatic, to change medication. If no symptoms, no change in treatment.

## 2019-10-04 NOTE — ED Provider Notes (Signed)
[place]   [id number] 10/02/19 Arrival Time: 1110  ASSESSMENT & PLAN:  1. Cystitis   2. Concern about STD in female without diagnosis     Begin: Meds ordered this encounter  Medications  . cephALEXin (KEFLEX) 500 MG capsule    Sig: Take 1 capsule (500 mg total) by mouth 2 (two) times daily.    Dispense:  10 capsule    Refill:  0      Discharge Instructions     You have been given the following medications today for treatment of suspected gonorrhea and/or chlamydia:  cefTRIAXone (ROCEPHIN) injection 250 mg azithromycin (ZITHROMAX) tablet 1,000 mg  Even though we have treated you today, we have sent testing for sexually transmitted infections. We will notify you of any positive results once they are received. If required, we will prescribe any medications you might need.  Please refrain from all sexual activity for at least the next seven days.     Pending: Labs Reviewed  URINE CULTURE   CERVICOVAGINAL ANCILLARY ONLY   No signs of PID/pyelonephritis. Will notify of any positive results. Instructed to refrain from sexual activity for at least seven days.  Reviewed expectations re: course of current medical issues. Questions answered. Outlined signs and symptoms indicating need for more acute intervention. Patient verbalized understanding. After Visit Summary given.   SUBJECTIVE:  Maria Ware is a 23 y.o. female who presents with complaint of mild vaginal discharge in addition to suprapubic discomfort/mild dysuria. Onset over the past week. Describes discharge as thin and white/clear. Afebrile. No abdominal or pelvic pain. Normal PO intake wihout n/v. No rashes or lesions. Reports that she is sexually active with single female partner. OTC treatment: none. History of STI: none reported.  Patient's last menstrual period was 09/18/2019.  ROS: As per HPI. All other systems negative.   OBJECTIVE:  Vitals:   10/02/19 1149 10/02/19 1151   BP: 116/62   Pulse: 69   Resp: 16   Temp: 98.7 F (37.1 C)   TempSrc: Oral   SpO2: 100%   Weight:  59 kg     General appearance: alert, cooperative, appears stated age and no distress HEENT: [place]; AT Throat: lips, mucosa, and tongue normal; teeth and gums normal CV: RRR Lungs: CTAB Back: no CVA tenderness; FROM at waist Abdomen: soft, non-tender GU: deferred Skin: warm and dry Psychological: alert and cooperative; normal mood and affect.    Labs Reviewed  POCT URINALYSIS DIP (DEVICE) - Abnormal; Notable for the following components:   Nitrite POSITIVE (*)    All other components within normal limits  POC URINE PREG, ED  POCT PREGNANCY, URINE  CERVICOVAGINAL ANCILLARY ONLY    No Known Allergies  Past Medical History:  Diagnosis Date  . PID (pelvic inflammatory disease)     Social History   Socioeconomic History  . Marital status: Single    Spouse name: Not on file  . Number of children: Not on file  . Years of education: Not on file  . Highest education level: Not on file  Occupational History  . Not on file  Social Needs  . Financial resource strain: Not on file  . Food insecurity    Worry: Not on file    Inability: Not on file  . Transportation needs    Medical: Not on file    Non-medical: Not on file  Tobacco Use  . Smoking status: Current Every Day Smoker  . Smokeless tobacco: Never Used  Substance and Sexual Activity  .  Alcohol use: No  . Drug use: No  . Sexual activity: Yes    Birth control/protection: Condom  Lifestyle  . Physical activity    Days per week: Not on file    Minutes per session: Not on file  . Stress: Not on file  Relationships  . Social Musician on phone: Not on file    Gets together: Not on file    Attends religious service: Not on file    Active member of club or organization: Not on file    Attends meetings of clubs or organizations: Not on file    Relationship status: Not on file  . Intimate partner  violence    Fear of current or ex partner: Not on file    Emotionally abused: Not on file    Physically abused: Not on file    Forced sexual activity: Not on file  Other Topics Concern  . Not on file  Social History Narrative  . Not on file          Mardella Layman, MD 10/04/19 1013

## 2019-10-06 LAB — CERVICOVAGINAL ANCILLARY ONLY
Bacterial vaginitis: POSITIVE — AB
Chlamydia: NEGATIVE
Neisseria Gonorrhea: NEGATIVE

## 2019-10-07 ENCOUNTER — Telehealth (HOSPITAL_COMMUNITY): Payer: Self-pay | Admitting: Emergency Medicine

## 2019-10-07 NOTE — Telephone Encounter (Signed)
Attempted to reach patient x2. No answer at this time. Voicemail left.    

## 2019-10-09 ENCOUNTER — Telehealth (HOSPITAL_COMMUNITY): Payer: Self-pay | Admitting: Emergency Medicine

## 2019-10-09 NOTE — Telephone Encounter (Signed)
Able to reach patient by phone, she states her UTI symptoms are gone and also no vaginal symptoms either, declines treatment for BV due to no symptoms. Pt informed about her Trichomonas test being indeterminate, and if she is concerned about trich, she needs to return for recheck of only trichomonas. Pt verbalized understadning, all questions answered.

## 2020-11-01 ENCOUNTER — Ambulatory Visit (HOSPITAL_COMMUNITY)
Admission: EM | Admit: 2020-11-01 | Discharge: 2020-11-01 | Disposition: A | Discharge status: Home / Self Care | Payer: Medicaid Other | Attending: Family Medicine | Admitting: Family Medicine

## 2020-11-01 ENCOUNTER — Encounter (HOSPITAL_COMMUNITY): Payer: Self-pay

## 2020-11-01 ENCOUNTER — Other Ambulatory Visit: Payer: Self-pay

## 2020-11-01 DIAGNOSIS — R102 Pelvic and perineal pain: Secondary | ICD-10-CM

## 2020-11-01 DIAGNOSIS — Z3490 Encounter for supervision of normal pregnancy, unspecified, unspecified trimester: Secondary | ICD-10-CM

## 2020-11-01 LAB — POCT URINALYSIS DIPSTICK, ED / UC
Bilirubin Urine: NEGATIVE
Glucose, UA: NEGATIVE mg/dL
Ketones, ur: >=160 mg/dL — AB
Leukocytes,Ua: NEGATIVE
Nitrite: NEGATIVE
Protein, ur: NEGATIVE mg/dL
Specific Gravity, Urine: >=1.03 (ref 1.005–1.030)
Urobilinogen, UA: 0.2 mg/dL (ref 0.0–1.0)
pH: 6 (ref 5.0–8.0)

## 2020-11-01 LAB — POC URINE PREG, ED
Preg Test, Ur: NEGATIVE
Preg Test, Ur: POSITIVE — AB

## 2020-11-01 NOTE — Discharge Instructions (Addendum)
    Please head to the emergency room now as I am concerned that you may have an ectopic pregnancy and/or PID. Your pregnancy test was positive today in our clinic. Please head to the MAU (Maternal Admission Unit) now.

## 2020-11-01 NOTE — ED Provider Notes (Addendum)
[facility]   MRN: [id number] DOB: June 26, 1996  Subjective:   Maria Ware is a 24 y.o. female presenting for amenorrhea, dyspareunia. LMP was 08/27/2020. Also has concerns about PID, she is sexually active, does not use condoms for protection. Has a history of PID.   No current facility-administered medications for this encounter.  Current Outpatient Medications:  .  cephALEXin (KEFLEX) 500 MG capsule, Take 1 capsule (500 mg total) by mouth 2 (two) times daily., Disp: 10 capsule, Rfl: 0   No Known Allergies  Past Medical History:  Diagnosis Date  . PID (pelvic inflammatory disease)      History reviewed. No pertinent surgical history.  History reviewed. No pertinent family history.  Social History   Tobacco Use  . Smoking status: Current Every Day Smoker  . Smokeless tobacco: Never Used  Substance Use Topics  . Alcohol use: No  . Drug use: No    ROS   Objective:   Vitals: BP 128/72 (BP Location: Right Arm)   Pulse 91   Temp 99.8 F (37.7 C) (Oral)   Resp 18   LMP 08/27/2020   SpO2 99%   Physical Exam Constitutional:      General: She is not in acute distress.    Appearance: Normal appearance. She is well-developed. She is not ill-appearing.  HENT:     Head: Normocephalic and atraumatic.     Nose: Nose normal.     Mouth/Throat:     Mouth: Mucous membranes are moist.     Pharynx: Oropharynx is clear.  Eyes:     General: No scleral icterus.    Extraocular Movements: Extraocular movements intact.     Pupils: Pupils are equal, round, and reactive to light.  Cardiovascular:     Rate and Rhythm: Normal rate.  Pulmonary:     Effort: Pulmonary effort is normal.  Abdominal:     General: Bowel sounds are normal. There is no distension.     Palpations: Abdomen is soft. There is no mass.     Tenderness: There is abdominal tenderness (mid-right lower pelvic area). There is guarding. There is no right CVA tenderness, left CVA  tenderness or rebound.  Skin:    General: Skin is warm and dry.  Neurological:     General: No focal deficit present.     Mental Status: She is alert and oriented to person, place, and time.  Psychiatric:        Mood and Affect: Mood normal.        Behavior: Behavior normal.     Positive pregnancy test on visual inspection. Erroneous entry in the system reports it was negative.   Results for orders placed or performed during the hospital encounter of 11/01/20 (from the past 24 hour(s))  POC urine pregnancy     Status: None   Collection Time: 11/01/20  1:13 PM  Result Value Ref Range   Preg Test, Ur Negative Negative  POC Urinalysis dipstick     Status: Abnormal   Collection Time: 11/01/20  1:16 PM  Result Value Ref Range   Glucose, UA NEGATIVE NEGATIVE mg/dL   Bilirubin Urine NEGATIVE NEGATIVE   Ketones, ur >=160 (A) NEGATIVE mg/dL   Specific Gravity, Urine >=1.030 1.005 - 1.030   Hgb urine dipstick TRACE (A) NEGATIVE   pH 6.0 5.0 - 8.0   Protein, ur NEGATIVE NEGATIVE mg/dL   Urobilinogen, UA 0.2 0.0 - 1.0 mg/dL   Nitrite NEGATIVE NEGATIVE   Leukocytes,Ua NEGATIVE NEGATIVE  POC urine preg, ED (not at [facility])     Status: Abnormal   Collection Time: 11/01/20  1:19 PM  Result Value Ref Range   Preg Test, Ur POSITIVE (A) NEGATIVE     Assessment and Plan :   PDMP not reviewed this encounter.  1. Pelvic pain   2. Pregnancy, unspecified gestational age     Offered patient Tylenol but she refused.  Given her pelvic pain and positive pregnancy test, recommended evaluation at the MAU.  Patient contracts for safety and will report there now.    Wallis Bamberg, PA-C 11/01/20 1328

## 2020-11-01 NOTE — ED Notes (Signed)
Negative test charted in error.

## 2020-11-01 NOTE — ED Triage Notes (Signed)
Pt presents with possible pregnancy; last known period was Aug 27, 2020.  Pt also complains of intermittent pelvic pain during sex for the past 3 months.

## 2020-11-02 ENCOUNTER — Encounter (HOSPITAL_COMMUNITY): Payer: Self-pay | Admitting: Obstetrics and Gynecology

## 2020-11-02 ENCOUNTER — Other Ambulatory Visit: Payer: Self-pay

## 2020-11-02 ENCOUNTER — Inpatient Hospital Stay (HOSPITAL_COMMUNITY)
Admission: AD | Admit: 2020-11-02 | Discharge: 2020-11-03 | Disposition: A | Discharge status: Home / Self Care | Payer: HRSA Program | Attending: Obstetrics and Gynecology | Admitting: Obstetrics and Gynecology

## 2020-11-02 DIAGNOSIS — Z3A09 9 weeks gestation of pregnancy: Secondary | ICD-10-CM | POA: Diagnosis not present

## 2020-11-02 DIAGNOSIS — O98511 Other viral diseases complicating pregnancy, first trimester: Secondary | ICD-10-CM | POA: Insufficient documentation

## 2020-11-02 DIAGNOSIS — O210 Mild hyperemesis gravidarum: Secondary | ICD-10-CM | POA: Insufficient documentation

## 2020-11-02 DIAGNOSIS — Z87891 Personal history of nicotine dependence: Secondary | ICD-10-CM | POA: Insufficient documentation

## 2020-11-02 DIAGNOSIS — O2341 Unspecified infection of urinary tract in pregnancy, first trimester: Secondary | ICD-10-CM | POA: Insufficient documentation

## 2020-11-02 DIAGNOSIS — O219 Vomiting of pregnancy, unspecified: Secondary | ICD-10-CM

## 2020-11-02 DIAGNOSIS — R6883 Chills (without fever): Secondary | ICD-10-CM | POA: Diagnosis not present

## 2020-11-02 DIAGNOSIS — R112 Nausea with vomiting, unspecified: Secondary | ICD-10-CM | POA: Diagnosis not present

## 2020-11-02 DIAGNOSIS — U071 COVID-19: Secondary | ICD-10-CM | POA: Diagnosis not present

## 2020-11-02 DIAGNOSIS — O99891 Other specified diseases and conditions complicating pregnancy: Secondary | ICD-10-CM | POA: Diagnosis not present

## 2020-11-02 DIAGNOSIS — Z8742 Personal history of other diseases of the female genital tract: Secondary | ICD-10-CM

## 2020-11-02 DIAGNOSIS — N390 Urinary tract infection, site not specified: Secondary | ICD-10-CM | POA: Diagnosis not present

## 2020-11-02 LAB — WET PREP, GENITAL
Sperm: NONE SEEN
Trich, Wet Prep: NONE SEEN
WBC, Wet Prep HPF POC: NONE SEEN
Yeast Wet Prep HPF POC: NONE SEEN

## 2020-11-02 MED ORDER — PROMETHAZINE HCL 25 MG/ML IJ SOLN
25.0000 mg | Freq: Once | INTRAVENOUS | Status: AC
  Administered 2020-11-02: 25 mg via INTRAVENOUS
  Filled 2020-11-02: qty 1

## 2020-11-02 NOTE — MAU Note (Signed)
Patient presents for vomiting X20 today, unable to keep anything down for the past 5 days.  Also reports chills since waking up today.  Denies VB or abdominal pain outside of when she is vomiting.

## 2020-11-03 ENCOUNTER — Encounter (HOSPITAL_COMMUNITY): Payer: Self-pay | Admitting: Obstetrics and Gynecology

## 2020-11-03 DIAGNOSIS — Z3A09 9 weeks gestation of pregnancy: Secondary | ICD-10-CM

## 2020-11-03 DIAGNOSIS — U071 COVID-19: Secondary | ICD-10-CM

## 2020-11-03 DIAGNOSIS — O99891 Other specified diseases and conditions complicating pregnancy: Secondary | ICD-10-CM

## 2020-11-03 DIAGNOSIS — O98511 Other viral diseases complicating pregnancy, first trimester: Secondary | ICD-10-CM | POA: Diagnosis present

## 2020-11-03 DIAGNOSIS — R112 Nausea with vomiting, unspecified: Secondary | ICD-10-CM

## 2020-11-03 DIAGNOSIS — O2341 Unspecified infection of urinary tract in pregnancy, first trimester: Secondary | ICD-10-CM

## 2020-11-03 DIAGNOSIS — Z8742 Personal history of other diseases of the female genital tract: Secondary | ICD-10-CM

## 2020-11-03 HISTORY — DX: COVID-19: O98.511

## 2020-11-03 HISTORY — DX: COVID-19: U07.1

## 2020-11-03 LAB — CBC WITH DIFFERENTIAL/PLATELET
Abs Immature Granulocytes: 0.07 10*3/uL (ref 0.00–0.07)
Basophils Absolute: 0 10*3/uL (ref 0.0–0.1)
Basophils Relative: 0 %
Eosinophils Absolute: 0 10*3/uL (ref 0.0–0.5)
Eosinophils Relative: 0 %
HCT: 29.9 % — ABNORMAL LOW (ref 36.0–46.0)
Hemoglobin: 10.9 g/dL — ABNORMAL LOW (ref 12.0–15.0)
Immature Granulocytes: 1 %
Lymphocytes Relative: 2 %
Lymphs Abs: 0.2 10*3/uL — ABNORMAL LOW (ref 0.7–4.0)
MCH: 30.4 pg (ref 26.0–34.0)
MCHC: 36.5 g/dL — ABNORMAL HIGH (ref 30.0–36.0)
MCV: 83.3 fL (ref 80.0–100.0)
Monocytes Absolute: 0.4 10*3/uL (ref 0.1–1.0)
Monocytes Relative: 3 %
Neutro Abs: 13.3 10*3/uL — ABNORMAL HIGH (ref 1.7–7.7)
Neutrophils Relative %: 94 %
Platelets: 267 10*3/uL (ref 150–400)
RBC: 3.59 MIL/uL — ABNORMAL LOW (ref 3.87–5.11)
RDW: 13.5 % (ref 11.5–15.5)
WBC: 14.1 10*3/uL — ABNORMAL HIGH (ref 4.0–10.5)
nRBC: 0 % (ref 0.0–0.2)

## 2020-11-03 LAB — URINALYSIS, ROUTINE W REFLEX MICROSCOPIC
Bilirubin Urine: NEGATIVE
Glucose, UA: NEGATIVE mg/dL
Hgb urine dipstick: NEGATIVE
Ketones, ur: 80 mg/dL — AB
Leukocytes,Ua: NEGATIVE
Nitrite: POSITIVE — AB
Protein, ur: 30 mg/dL — AB
Specific Gravity, Urine: 1.021 (ref 1.005–1.030)
pH: 5 (ref 5.0–8.0)

## 2020-11-03 LAB — RESP PANEL BY RT-PCR (FLU A&B, COVID) ARPGX2
Influenza A by PCR: NEGATIVE
Influenza B by PCR: NEGATIVE
SARS Coronavirus 2 by RT PCR: POSITIVE — AB

## 2020-11-03 MED ORDER — SODIUM CHLORIDE 0.9 % IV SOLN
2.0000 g | Freq: Once | INTRAVENOUS | Status: AC
  Administered 2020-11-03: 2 g via INTRAVENOUS
  Filled 2020-11-03: qty 2

## 2020-11-03 MED ORDER — SODIUM CHLORIDE 0.9 % IV SOLN
8.0000 mg | Freq: Once | INTRAVENOUS | Status: AC
  Administered 2020-11-03: 8 mg via INTRAVENOUS
  Filled 2020-11-03: qty 4

## 2020-11-03 MED ORDER — SODIUM CHLORIDE 0.9 % IV BOLUS
1000.0000 mL | Freq: Once | INTRAVENOUS | Status: AC
  Administered 2020-11-03: 1000 mL via INTRAVENOUS

## 2020-11-03 MED ORDER — ONDANSETRON 8 MG PO TBDP: 8.0000 mg | ORAL_TABLET | Freq: Three times a day (TID) | ORAL | 1 refills | Status: DC | PRN

## 2020-11-03 MED ORDER — PROMETHAZINE HCL 25 MG PO TABS: 25.0000 mg | ORAL_TABLET | Freq: Four times a day (QID) | ORAL | 2 refills | Status: DC | PRN

## 2020-11-03 MED ORDER — ACETAMINOPHEN 325 MG PO TABS: 650.0000 mg | ORAL_TABLET | Freq: Four times a day (QID) | ORAL | 1 refills | Status: DC | PRN

## 2020-11-03 MED ORDER — LACTATED RINGERS IV BOLUS: 1000.0000 mL | Freq: Once | INTRAVENOUS | Status: DC

## 2020-11-03 NOTE — Discharge Instructions (Signed)
10 Things You Can Do to Manage Your COVID-19 Symptoms at Home If you have possible or confirmed COVID-19: 1. Stay home from work and school. And stay away from other public places. If you must go out, avoid using any kind of public transportation, ridesharing, or taxis. 2. Monitor your symptoms carefully. If your symptoms get worse, call your healthcare provider immediately. 3. Get rest and stay hydrated. 4. If you have a medical appointment, call the healthcare provider ahead of time and tell them that you have or may have COVID-19. 5. For medical emergencies, call 911 and notify the dispatch personnel that you have or may have COVID-19. 6. Cover your cough and sneezes with a tissue or use the inside of your elbow. 7. Wash your hands often with soap and water for at least 20 seconds or clean your hands with an alcohol-based hand sanitizer that contains at least 60% alcohol. 8. As much as possible, stay in a specific room and away from other people in your home. Also, you should use a separate bathroom, if available. If you need to be around other people in or outside of the home, wear a mask. 9. Avoid sharing personal items with other people in your household, like dishes, towels, and bedding. 10. Clean all surfaces that are touched often, like counters, tabletops, and doorknobs. Use household cleaning sprays or wipes according to the label instructions. [email or web address] 05/10/2019 This information is not intended to replace advice given to you by your health care provider. Make sure you discuss any questions you have with your health care provider. Document Revised: 10/12/2019 Document Reviewed: 10/12/2019 Elsevier Patient Education  2020 Elsevier Inc.   COVID-19: Quarantine vs. Isolation QUARANTINE keeps someone who was in close contact with someone who has COVID-19 away from others. If you had close contact with a person who has COVID-19  Stay home until 14 days after your last  contact.  Check your temperature twice a day and watch for symptoms of COVID-19.  If possible, stay away from people who are at higher-risk for getting very sick from COVID-19. ISOLATION keeps someone who is sick or tested positive for COVID-19 without symptoms away from others, even in their own home. If you are sick and think or know you have COVID-19  Stay home until after ? At least 10 days since symptoms first appeared and ? At least 24 hours with no fever without fever-reducing medication and ? Symptoms have improved If you tested positive for COVID-19 but do not have symptoms  Stay home until after ? 10 days have passed since your positive test If you live with others, stay in a specific "sick room" or area and away from other people or animals, including pets. Use a separate bathroom, if available. [email or web address] 05/29/2019 This information is not intended to replace advice given to you by your health care provider. Make sure you discuss any questions you have with your health care provider. Document Revised: 10/12/2019 Document Reviewed: 10/12/2019 Elsevier Patient Education  2020 Elsevier Inc.  [facility] Providers   Francoise Ceo      Phone: [phone]  [facility]     Phone: [phone]  [facility] [place]  Phone: [phone]  [facility] [place]  Phone: [phone]  [facility]    Phone: [phone]   [facility] [place])    Phone: [phone]  [facility]    Phone: [phone]  [facility]    Phone: [phone]  [facility]    Phone: [phone]  [facility] Phone: [phone]   [facility]  Phone: [phone]  [facility]    Phone: [phone]  [facility]  [place]   Phone: [phone]  [facility]      Phone: [phone]  [facility]    Phone: [phone]  [facility]     Phone: [phone]

## 2020-11-03 NOTE — MAU Provider Note (Addendum)
Chief Complaint: Emesis During Pregnancy   Event Date/Time   First Provider Initiated Contact with Patient 11/02/20 2224     SUBJECTIVE HPI: Maria Ware is a 24 y.o. G1P0 at [redacted]w[redacted]d by LMP who presents to Maternity Admissions reporting nausea and vomiting x5 days that is worsened over the past 24 hours.  Reports that she vomited approximately 20 times today.  Developed chills today.  Patient is also concerned that she may have PID because she has pelvic pain and spotting 1 month ago and has had PID before.  Denies known contact department with STI. On review of chart urgent care yesterday where she was diagnosed with pelvic pain and was instructed to come to MAU for evaluation, but patient states that she has not had pelvic pain or abdominal pain in a month, then said maybe she has some discomfort 15 days ago, but none since.  Patient states she has had been vomiting up to 3 times per day prior to this exacerbation.  Is not taking any medication or tried anything for her symptoms. Has not started prenatal care or received any US's.   Associated signs and symptoms: Positive for chills, nausea, vomiting.  Has not checked temperature.  Negative for abdominal pain, pelvic pain, vaginal bleeding  dysuria, hematuria, urgency frequency, cough, shortness of breath, loss of taste or smell.  Positive for feeling "like urine comes out".  Positive for sick contact.  Partner had fever, chills, Shortness x2 days.  Not tested for Covid.  Patient has not had Covid vaccine.  Past Medical History:  Diagnosis Date  . PID (pelvic inflammatory disease)    OB History  Gravida Para Term Preterm AB Living  1            SAB IAB Ectopic Multiple Live Births               # Outcome Date GA Lbr Len/2nd Weight Sex Delivery Anes PTL Lv  1 Current            History reviewed. No pertinent surgical history. Social History   Socioeconomic History  . Marital status: Single    Spouse name: Not on file  . Number  of children: Not on file  . Years of education: Not on file  . Highest education level: Not on file  Occupational History  . Not on file  Tobacco Use  . Smoking status: Former Games developer  . Smokeless tobacco: Never Used  Substance and Sexual Activity  . Alcohol use: No  . Drug use: No  . Sexual activity: Yes    Birth control/protection: Condom, None  Other Topics Concern  . Not on file  Social History Narrative  . Not on file   Social Determinants of Health   Financial Resource Strain: Not on file  Food Insecurity: Not on file  Transportation Needs: Not on file  Physical Activity: Not on file  Stress: Not on file  Social Connections: Not on file  Intimate Partner Violence: Not on file   History reviewed. No pertinent family history. No current facility-administered medications on file prior to encounter.   Current Outpatient Medications on File Prior to Encounter  Medication Sig Dispense Refill  . cephALEXin (KEFLEX) 500 MG capsule Take 1 capsule (500 mg total) by mouth 2 (two) times daily. 10 capsule 0   No Known Allergies  I have reviewed patient's Past Medical Hx, Surgical Hx, Family Hx, Social Hx, medications and allergies.   Review of Systems  Constitutional: Positive for  chills and fatigue.  HENT: Negative for congestion.   Respiratory: Negative for cough and shortness of breath.   Gastrointestinal: Positive for nausea and vomiting. Negative for abdominal pain and diarrhea.  Genitourinary: Negative for difficulty urinating, dysuria, flank pain, frequency, hematuria, pelvic pain, urgency, vaginal bleeding and vaginal discharge.       "Urine comes out slowly"  Musculoskeletal: Negative for back pain.  Neurological: Negative for headaches.    OBJECTIVE Patient Vitals for the past 24 hrs:  BP Temp Pulse Resp  11/02/20 2137 132/71 -- (!) 108 --  11/02/20 2135 -- 98.3 F (36.8 C) -- 19   Constitutional: Well-developed, well-nourished female in mild distress.   Mildly ill-appearing. Cardiovascular: Mild tachycardia Respiratory: normal rate and effort.  GI: Abd soft, non-tender, fundus normal-palpable Neurologic: Alert and oriented x 4.  GU: Declined.  Blind GC/chlamydia, wet prep collected.  LAB RESULTS Results for orders placed or performed during the hospital encounter of 11/02/20 (from the past 24 hour(s))  Resp Panel by RT-PCR (Flu A&B, Covid) Nasopharyngeal Swab     Status: Abnormal   Collection Time: 11/02/20 11:12 PM   Specimen: Nasopharyngeal Swab; Nasopharyngeal(NP) swabs in vial transport medium  Result Value Ref Range   SARS Coronavirus 2 by RT PCR POSITIVE (A) NEGATIVE   Influenza A by PCR NEGATIVE NEGATIVE   Influenza B by PCR NEGATIVE NEGATIVE  Wet prep, genital     Status: Abnormal   Collection Time: 11/02/20 11:12 PM   Specimen: Nasopharyngeal Swab  Result Value Ref Range   Yeast Wet Prep HPF POC NONE SEEN NONE SEEN   Trich, Wet Prep NONE SEEN NONE SEEN   Clue Cells Wet Prep HPF POC PRESENT (A) NONE SEEN   WBC, Wet Prep HPF POC NONE SEEN NONE SEEN   Sperm NONE SEEN   Urinalysis, Routine w reflex microscopic Nasopharyngeal Swab     Status: Abnormal   Collection Time: 11/02/20 11:23 PM  Result Value Ref Range   Color, Urine YELLOW YELLOW   APPearance HAZY (A) CLEAR   Specific Gravity, Urine 1.021 1.005 - 1.030   pH 5.0 5.0 - 8.0   Glucose, UA NEGATIVE NEGATIVE mg/dL   Hgb urine dipstick NEGATIVE NEGATIVE   Bilirubin Urine NEGATIVE NEGATIVE   Ketones, ur 80 (A) NEGATIVE mg/dL   Protein, ur 30 (A) NEGATIVE mg/dL   Nitrite POSITIVE (A) NEGATIVE   Leukocytes,Ua NEGATIVE NEGATIVE   RBC / HPF 0-5 0 - 5 RBC/hpf   WBC, UA 0-5 0 - 5 WBC/hpf   Bacteria, UA MANY (A) NONE SEEN   Squamous Epithelial / LPF 0-5 0 - 5   Mucus PRESENT   CBC with Differential/Platelet     Status: Abnormal   Collection Time: 11/03/20  3:39 AM  Result Value Ref Range   WBC 14.1 (H) 4.0 - 10.5 K/uL   RBC 3.59 (L) 3.87 - 5.11 MIL/uL   Hemoglobin  10.9 (L) 12.0 - 15.0 g/dL   HCT 41.6 (L) 60.6 - 30.1 %   MCV 83.3 80.0 - 100.0 fL   MCH 30.4 26.0 - 34.0 pg   MCHC 36.5 (H) 30.0 - 36.0 g/dL   RDW 60.1 09.3 - 23.5 %   Platelets 267 150 - 400 K/uL   nRBC 0.0 0.0 - 0.2 %   Neutrophils Relative % 94 %   Neutro Abs 13.3 (H) 1.7 - 7.7 K/uL   Lymphocytes Relative 2 %   Lymphs Abs 0.2 (L) 0.7 - 4.0 K/uL   Monocytes Relative  3 %   Monocytes Absolute 0.4 0.1 - 1.0 K/uL   Eosinophils Relative 0 %   Eosinophils Absolute 0.0 0.0 - 0.5 K/uL   Basophils Relative 0 %   Basophils Absolute 0.0 0.0 - 0.1 K/uL   Immature Granulocytes 1 %   Abs Immature Granulocytes 0.07 0.00 - 0.07 K/uL    IMAGING Informal bedside ultrasound shows 9.1-week live SIUP.  Fetal heart rate 176.  MAU COURSE Orders Placed This Encounter  Procedures  . Resp Panel by RT-PCR (Flu A&B, Covid) Nasopharyngeal Swab  . Wet prep, genital  . Culture, OB Urine  . Urinalysis, Routine w reflex microscopic Urine, Clean Catch  . CBC with Differential/Platelet  . Insert peripheral IV  . Discharge patient   Meds ordered this encounter  Medications  . promethazine (PHENERGAN) 25 mg in lactated ringers 1,000 mL infusion  . DISCONTD: lactated ringers bolus 1,000 mL  . ondansetron (ZOFRAN) 8 mg in sodium chloride 0.9 % 50 mL IVPB  . cefTRIAXone (ROCEPHIN) 2 g in sodium chloride 0.9 % 100 mL IVPB    Order Specific Question:   Antibiotic Indication:    Answer:   UTI    Order Specific Question:   Other Indication:    Answer:   Pt also suspicious of having PID. Having severe N/V. Not candidate for PO's.  . sodium chloride 0.9 % bolus 1,000 mL  . promethazine (PHENERGAN) 25 MG tablet    Sig: Take 1 tablet (25 mg total) by mouth every 6 (six) hours as needed for nausea or vomiting.    Dispense:  30 tablet    Refill:  2    Order Specific Question:   Supervising Provider    Answer:   ERVIN, MICHAEL L [[id number]]  . ondansetron (ZOFRAN ODT) 8 MG disintegrating tablet    Sig: Take 1 tablet  (8 mg total) by mouth every 8 (eight) hours as needed for nausea or vomiting.    Dispense:  20 tablet    Refill:  1    Order Specific Question:   Supervising Provider    Answer:   ERVIN, MICHAEL L [[id number]]  . acetaminophen (TYLENOL) 325 MG tablet    Sig: Take 2 tablets (650 mg total) by mouth every 6 (six) hours as needed for moderate pain, fever or headache.    Dispense:  60 tablet    Refill:  1    Order Specific Question:   Supervising Provider    Answer:   ERVIN, MICHAEL L [[id number]]    MDM -COVID-19.  Nausea and vomiting exacerbation likely due to Covid.  Minimal improvement with nausea and vomiting after Phenergan.  Lengthy discussion with patient about other medications to try, risks/benefits, realistic expectations.  Patient verbalizes understanding and wishes to proceed with Zofran. Great improvement after Zofran. Tolerating PO's and reports feeling much better. Low-grade fever chills likely due to Covid. No Abdominal or pelvic pain so low suspicion of PID.  Patient declined pelvic exam so difficult to make definitive Dx. Pt requests GC/Chlamydia, Wet Prep. GC/Chlmaydia pending. Wet Prep _ clue, but pt denies vaginal discharge, odor of pain so will not Tx as BV.   - UA + nitrite and bacteria, Neg WBCs, RBC's. Only urinary complaint is of urine coming out slowly. On review of chart pt has had three urine cultures in 3 years. No flank pain or CVAT. Possible that abnl UA, urinary Sx, N/V and fever/chills could be complicated UTI, but most Sx more likely 2/2 Covid. Will give IV  Rocephin for UTI coverage. (Will cover GC as well if +.)   -Initial initially denied headache, but then reported that she has been having migraines since beginning of pregnancy.  Headache is less comfortable, improves with rest.  Has not tried any medications for pain.  Patient discussed multiple 3 reporting patient is having dental pain has not changed.  Recommended dental issue.  Dental letter given.  Increase fluids, may  tryTylenol and caffeine.  ASSESSMENT 1. COVID-19 affecting pregnancy in first trimester   2. Nausea/vomiting in pregnancy   3. Urinary tract infection in mother during first trimester of pregnancy   4. History of acute PID     PLAN Discharge home in stable condition. Covid precautions First Trimester precautions.  Urine culture GC/chlamydia cultures pending. List of providers given. Monoclonal antibody handout given.  Patient information given to monoclonal antibody clinic. Allergies as of 11/03/2020   No Known Allergies     Medication List    STOP taking these medications   cephALEXin 500 MG capsule Commonly known as: KEFLEX     TAKE these medications   acetaminophen 325 MG tablet Commonly known as: TYLENOL Take 2 tablets (650 mg total) by mouth every 6 (six) hours as needed for moderate pain, fever or headache.   ondansetron 8 MG disintegrating tablet Commonly known as: Zofran ODT Take 1 tablet (8 mg total) by mouth every 8 (eight) hours as needed for nausea or vomiting.   promethazine 25 MG tablet Commonly known as: PHENERGAN Take 1 tablet (25 mg total) by mouth every 6 (six) hours as needed for nausea or vomiting.        Maria Ware, [place], CNM 11/03/2020  6:01 AM

## 2020-11-04 LAB — GC/CHLAMYDIA PROBE AMP (~~LOC~~) NOT AT ARMC
Chlamydia: NEGATIVE
Comment: NEGATIVE
Comment: NORMAL
Neisseria Gonorrhea: NEGATIVE

## 2020-11-05 ENCOUNTER — Telehealth: Payer: Self-pay | Admitting: Unknown Physician Specialty

## 2020-11-05 LAB — CULTURE, OB URINE: Culture: >=100000 — AB

## 2020-11-05 NOTE — Telephone Encounter (Signed)
Called to Discuss with patient about Covid symptoms and the use of the monoclonal antibody infusion for those with mild to moderate Covid symptoms and at a high risk of hospitalization.     Pt appears to qualify for this infusion due to co-morbid conditions and/or a member of an at-risk group in accordance with the FDA Emergency Use Authorization.    Unable to reach pt   Unable to leave voice mail 

## 2020-11-09 NOTE — L&D Delivery Note (Addendum)
Delivery Note At 6:08 PM a viable female was delivered via Vaginal, Spontaneous (Presentation: Left Occiput Anterior).  APGAR: 9, 9; weight pending.   Placenta status: Spontaneous, Intact.  Cord: 3 vessels with the following complications: None.  Cord pH: N/A  Anesthesia: Epidural Episiotomy: None Lacerations: Bilateral Labial repaired with 3.0 vicryl rapide and Vaginal 1st degree repaired with 2.0 vicryl rapide Suture Repair: 2.0 3.0 vicryl rapide Est. Blood Loss (mL): 55 mL   Mom to postpartum.  Baby to Couplet care / Skin to Skin.  Athena Masse 05/29/2021, 6:43 PM   Midwife attestation: I was gloved and present for delivery in its entirety and I agree with the above student's note.  Donette Larry, CNM 7:00 PM

## 2020-12-19 ENCOUNTER — Inpatient Hospital Stay (HOSPITAL_COMMUNITY)
Admission: AD | Admit: 2020-12-19 | Discharge: 2020-12-19 | Disposition: A | Discharge status: Home / Self Care | Payer: Medicaid Other | Attending: Family Medicine | Admitting: Family Medicine

## 2020-12-19 ENCOUNTER — Encounter (HOSPITAL_COMMUNITY): Payer: Self-pay | Admitting: Family Medicine

## 2020-12-19 ENCOUNTER — Other Ambulatory Visit: Payer: Self-pay

## 2020-12-19 DIAGNOSIS — Z87891 Personal history of nicotine dependence: Secondary | ICD-10-CM | POA: Diagnosis not present

## 2020-12-19 DIAGNOSIS — Z8616 Personal history of COVID-19: Secondary | ICD-10-CM | POA: Insufficient documentation

## 2020-12-19 DIAGNOSIS — Z3A16 16 weeks gestation of pregnancy: Secondary | ICD-10-CM | POA: Diagnosis not present

## 2020-12-19 DIAGNOSIS — R109 Unspecified abdominal pain: Secondary | ICD-10-CM | POA: Diagnosis not present

## 2020-12-19 DIAGNOSIS — O26899 Other specified pregnancy related conditions, unspecified trimester: Secondary | ICD-10-CM

## 2020-12-19 DIAGNOSIS — Z79899 Other long term (current) drug therapy: Secondary | ICD-10-CM | POA: Insufficient documentation

## 2020-12-19 DIAGNOSIS — O26892 Other specified pregnancy related conditions, second trimester: Secondary | ICD-10-CM | POA: Insufficient documentation

## 2020-12-19 NOTE — MAU Note (Addendum)
Pt reports she has been having tenderness in her stomach  For the past 2 weeks. Went to her primary doctor and she was concerned about her tenderness and was told to come here to get checked out. Denies any vag bleeding or discharge. Pt also reported she was dx with BV last week but has not stared ABX yet.

## 2020-12-19 NOTE — MAU Provider Note (Signed)
History     [id number]  Arrival date and time: 12/19/20 1658    Chief Complaint  Patient presents with  . Abdominal Pain     HPI Maria Ware is a 25 y.o. at [redacted]w[redacted]d by LMP, who presents for abdominal pain.   Patient reports three week history of intermittent abdominal pain at night No other associated symptoms Denies vaginal bleeding, loss of fluid, vaginal discharge, burning or pain with urination Has not taken anything for the pain Worried about an ectopic     OB History    Gravida  1   Para      Term      Preterm      AB      Living        SAB      IAB      Ectopic      Multiple      Live Births              Past Medical History:  Diagnosis Date  . COVID-19 affecting pregnancy in first trimester 11/03/2020  . PID (pelvic inflammatory disease)     History reviewed. No pertinent surgical history.  Family History  Problem Relation Age of Onset  . Cancer Maternal Grandmother     Social History   Socioeconomic History  . Marital status: Single    Spouse name: Not on file  . Number of children: Not on file  . Years of education: Not on file  . Highest education level: Not on file  Occupational History  . Not on file  Tobacco Use  . Smoking status: Former Games developer  . Smokeless tobacco: Never Used  Vaping Use  . Vaping Use: Never used  Substance and Sexual Activity  . Alcohol use: No  . Drug use: No  . Sexual activity: Yes    Birth control/protection: Condom, None  Other Topics Concern  . Not on file  Social History Narrative  . Not on file   Social Determinants of Health   Financial Resource Strain: Not on file  Food Insecurity: Not on file  Transportation Needs: Not on file  Physical Activity: Not on file  Stress: Not on file  Social Connections: Not on file  Intimate Partner Violence: Not on file    No Known Allergies  No current facility-administered medications on file prior to encounter.   Current  Outpatient Medications on File Prior to Encounter  Medication Sig Dispense Refill  . acetaminophen (TYLENOL) 325 MG tablet Take 2 tablets (650 mg total) by mouth every 6 (six) hours as needed for moderate pain, fever or headache. 60 tablet 1  . ondansetron (ZOFRAN ODT) 8 MG disintegrating tablet Take 1 tablet (8 mg total) by mouth every 8 (eight) hours as needed for nausea or vomiting. 20 tablet 1  . promethazine (PHENERGAN) 25 MG tablet Take 1 tablet (25 mg total) by mouth every 6 (six) hours as needed for nausea or vomiting. 30 tablet 2     ROS Pertinent positives and negative per HPI, all others reviewed and negative  Physical Exam   BP 121/68   Pulse 77   Temp 98.6 F (37 C)   Resp 18   Ht 5\' 7"  (1.702 m)   Wt 57.2 kg   LMP 08/27/2020   BMI 19.73 kg/m   Patient Vitals for the past 24 hrs:  BP Temp Pulse Resp Height Weight  12/19/20 1837 121/68 - 77 - - -  12/19/20 1719 107/77 98.6  F (37 C) 78 18 5\' 7"  (1.702 m) 57.2 kg    Physical Exam Vitals reviewed.  Constitutional:      General: She is not in acute distress.    Appearance: She is well-developed and well-nourished. She is not diaphoretic.  Eyes:     General: No scleral icterus. Pulmonary:     Effort: Pulmonary effort is normal. No respiratory distress.  Abdominal:     General: There is no distension.     Palpations: Abdomen is soft.     Tenderness: There is no abdominal tenderness. There is no guarding or rebound.  Musculoskeletal:        General: No edema.  Skin:    General: Skin is warm and dry.  Neurological:     Mental Status: She is alert.     Coordination: Coordination normal.  Psychiatric:        Mood and Affect: Mood and affect normal.      Cervical Exam    Bedside Ultrasound Pt informed that the ultrasound is considered a limited OB ultrasound and is not intended to be a complete ultrasound exam.  Patient also informed that the ultrasound is not being completed with the intent of assessing  for fetal or placental anomalies or any pelvic abnormalities.  Explained that the purpose of today's ultrasound is to assess for  viability.  Patient acknowledges the purpose of the exam and the limitations of the study.    My interpretation: viable IUP, normal somatic and cardiac movement, subjectively normal fluid   Labs No results found for this or any previous visit (from the past 24 hour(s)).  Imaging No results found.  MAU Course  Procedures Lab Orders  No laboratory test(s) ordered today   No orders of the defined types were placed in this encounter.  Imaging Orders  No imaging studies ordered today    MDM mild  Assessment and Plan  #Abdominal pain in pegnancy Reassured patient that she has IUP measuring c/w her LMP. Normal fetal movement, cardiac activity, and fluid seen. Ruled out for ectopic. Denies any other complaints.   Discharged to home in stable condition.    , MD/MPH 12/19/20 7:20 PM  Allergies as of 12/19/2020   No Known Allergies     Medication List    TAKE these medications   acetaminophen 325 MG tablet Commonly known as: TYLENOL Take 2 tablets (650 mg total) by mouth every 6 (six) hours as needed for moderate pain, fever or headache.   ondansetron 8 MG disintegrating tablet Commonly known as: Zofran ODT Take 1 tablet (8 mg total) by mouth every 8 (eight) hours as needed for nausea or vomiting.   promethazine 25 MG tablet Commonly known as: PHENERGAN Take 1 tablet (25 mg total) by mouth every 6 (six) hours as needed for nausea or vomiting.

## 2020-12-23 ENCOUNTER — Other Ambulatory Visit: Payer: Self-pay

## 2020-12-23 ENCOUNTER — Telehealth (INDEPENDENT_AMBULATORY_CARE_PROVIDER_SITE_OTHER): Payer: Medicaid Other | Admitting: *Deleted

## 2020-12-23 DIAGNOSIS — Z8742 Personal history of other diseases of the female genital tract: Secondary | ICD-10-CM

## 2020-12-23 DIAGNOSIS — Z3490 Encounter for supervision of normal pregnancy, unspecified, unspecified trimester: Secondary | ICD-10-CM | POA: Insufficient documentation

## 2020-12-23 DIAGNOSIS — Z3A17 17 weeks gestation of pregnancy: Secondary | ICD-10-CM

## 2020-12-23 DIAGNOSIS — Z3402 Encounter for supervision of normal first pregnancy, second trimester: Secondary | ICD-10-CM

## 2020-12-23 HISTORY — DX: Personal history of other diseases of the female genital tract: Z87.42

## 2020-12-23 NOTE — Progress Notes (Signed)
New OB Intake  I connected with  Maria Ware on 12/23/20 at 1:20 by MyChart and verified that I am speaking with the correct person using two identifiers. Nurse is located at [facility] and pt is located at home.  I discussed the limitations, risks, security and privacy concerns of performing an evaluation and management service by telephone and the availability of in person appointments. I also discussed with the patient that there may be a patient responsible charge related to this service. The patient expressed understanding and agreed to proceed.  I explained I am completing New OB Intake today. We discussed her EDD of 06/03/21 that is based on LMP of 08/27/20. Pt is G1/P0. I reviewed her allergies, medications, Medical/Surgical/OB history, and appropriate screenings. I informed her of [facility] services. Based on history, this is a/an uncomplicated pregnancy.  Patient Active Problem List   Diagnosis Date Noted  . Supervision of low-risk pregnancy 12/23/2020  . History of acute PID 12/23/2020  . COVID-19 affecting pregnancy in first trimester 11/03/2020     Concerns addressed today  Delivery Plans:  Plans to deliver at [facility].   MyChart/Babyscripts MyChart access verified. I explained pt will have some visits in office and some virtually. Babyscripts instructions given.   Blood Pressure Cuff Patient does not have a blood pressure cuff. I explained we can order blood pressure cuff RX once she has active pregnancy medicaid.  Explained after she has a bp cuff  pt will check weekly and document in Babyscripts.  Anatomy US Explained first scheduled [place] will be around 19 weeks. Anatomy US scheduled for 01/02/21 at 0745. Pt notified to arrive at 0730.  Explained then she will come downstairs and check in for her first prenatal visit.   Labs Discussed Avelina Laine genetic screening with patient. Would like both Panorama and Horizon drawn at new OB visit. Routine prenatal labs needed.  Covid  Vaccine Patient has not had a covid vaccine.   [facility] Referral Patient already has [facility] appointment this week.    First visit review I reviewed new OB appt with pt. I explained she will have a pelvic exam, ob bloodwork with genetic screening, and PAP smear. Explained pt will be seen by Vonzella Nipple, PA at first visit; encounter routed to appropriate provider. Patient is new and was offered monthly Zoom meeting  Rondall Radigan,RN 12/23/2020  2:23 PM

## 2020-12-23 NOTE — Progress Notes (Signed)
Chart reviewed for nurse visit. Agree with plan of care.   Marny Lowenstein, PA-C 12/23/2020 4:12 PM

## 2020-12-23 NOTE — Patient Instructions (Addendum)
- At our [facility], we work as an integrated team, providing care to address both physical and emotional health. Your medical provider may refer you to see our Behavioral Health Clinician (BHC) on the same day you see your medical provider, as availability permits; often scheduled virtually at your convenience.  Our BHC is available to all patients, visits generally last between 20-30 minutes, but can be longer or shorter, depending on patient need. The BHC offers help with stress management, coping with symptoms of depression and anxiety, major life changes , sleep issues, changing risky behavior, grief and loss, life stress, working on personal life goals, and  behavioral health issues, as these all affect your overall health and wellness.  The BHC is NOT available for the following: FMLA paperwork, court-ordered evaluations, specialty assessments (custody or disability), letters to employers, or obtaining certification for an emotional support animal. The BHC does not provide long-term therapy. You have the right to refuse integrated behavioral health services, or to reschedule to see the BHC at a later date.  Confidentiality exception: If it is suspected that a child or disabled adult is being abused or neglected, we are required by law to report that to either Child Protective Services or Adult Protective Services.  If you have a diagnosis of Bipolar affective disorder, Schizophrenia, or recurrent Major depressive disorder, we will recommend that you establish care with a psychiatrist, as these are lifelong, chronic conditions, and we want your overall emotional health and medications to be more closely monitored. If you anticipate needing extended maternity leave due to mental health issues postpartum, it it recommended you inform your medical provider, so we can put in a referral to a  psychiatrist as soon as possible. The BHC is unable to recommend an extended maternity leave for mental  health issues. Your medical provider or BHC may refer you to a therapist for ongoing, traditional therapy, or to a psychiatrist, for medication management, if it would benefit your overall health. Depending on your insurance, you may have a copay to see the BHC. If you are uninsured, it is recommended that you apply for financial assistance. (Forms may be requested at the front desk for in-person visits, via MyChart, or request a form during a virtual visit).  If you see the BHC more than 6 times, you will have to complete a comprehensive clinical assessment interview with the BHC to resume integrated services.  For virtual visits with the BHC, you must be physically in the state of [place] at the time of the visit. For example, if you live in [place], you will have to do an in-person visit with the BHC, and your out-of-state insurance may not cover behavioral health services in [place]. f you are going out of the state or country for any reason, the BHC may see you virtually when you return to [place], but not while you are physically outside of .     Meet the Provider Zoom Sessions      [facility] is now offering FREE monthly 1-hour virtual Zoom sessions for new, current, and prospective patients.        During these sessions, you can:   Learn about our practice, model of care, services   Get answers to questions about pregnancy and birth during COVID   Pick your provider's brain about anything else!    Sessions will be hosted by [facility] Nurse Practitioners, Physician Assistants, Physicians and Midwives            No registration required      2021 Dates:      All at 6pm     October 21st     November 18th   December 16th     January 20th  February 17th    To join one of these meetings, a few minutes before it is set to start:     Copy/paste the link into your web  browser:  [email or web address]    OR  Scan the QR code below (open up your camera and point towards QR code; click on tab that pops up on your phone ("zoom")    

## 2020-12-30 ENCOUNTER — Encounter: Payer: Medicaid Other | Admitting: Medical

## 2021-01-02 ENCOUNTER — Ambulatory Visit: Payer: Medicaid Other

## 2021-01-02 ENCOUNTER — Ambulatory Visit (INDEPENDENT_AMBULATORY_CARE_PROVIDER_SITE_OTHER): Payer: Self-pay | Admitting: Medical

## 2021-01-02 ENCOUNTER — Other Ambulatory Visit: Payer: Self-pay

## 2021-01-02 ENCOUNTER — Encounter: Payer: Self-pay | Admitting: Medical

## 2021-01-02 ENCOUNTER — Other Ambulatory Visit (HOSPITAL_COMMUNITY)
Admission: RE | Admit: 2021-01-02 | Discharge: 2021-01-02 | Disposition: A | Discharge status: Home / Self Care | Payer: Medicaid Other | Source: Ambulatory Visit | Attending: Medical | Admitting: Medical

## 2021-01-02 VITALS — BP 115/65 | HR 103 | Wt 129.8 lb

## 2021-01-02 DIAGNOSIS — Z3492 Encounter for supervision of normal pregnancy, unspecified, second trimester: Secondary | ICD-10-CM

## 2021-01-02 DIAGNOSIS — O0932 Supervision of pregnancy with insufficient antenatal care, second trimester: Secondary | ICD-10-CM | POA: Insufficient documentation

## 2021-01-02 DIAGNOSIS — Z3A19 19 weeks gestation of pregnancy: Secondary | ICD-10-CM

## 2021-01-02 DIAGNOSIS — O2342 Unspecified infection of urinary tract in pregnancy, second trimester: Secondary | ICD-10-CM

## 2021-01-02 NOTE — Progress Notes (Signed)
   PRENATAL VISIT NOTE  Subjective:  Maria Ware is a 25 y.o. G1P0 at [redacted]w[redacted]d being seen today for her first prenatal visit for this pregnancy.  She is currently monitored for the following issues for this low-risk pregnancy and has COVID-19 affecting pregnancy in first trimester; Supervision of low-risk pregnancy; History of acute PID; and Late prenatal care affecting pregnancy in second trimester on their problem list.  Patient reports no complaints.  Contractions: Not present. Vag. Bleeding: None.  Movement: Absent. Denies leaking of fluid.   She is planning to breastfeed. Desires contraception, unsure of type.   The following portions of the patient's history were reviewed and updated as appropriate: allergies, current medications, past family history, past medical history, past social history, past surgical history and problem list.   Objective:   Vitals:   01/02/21 0912  BP: 115/65  Pulse: (!) 103  Weight: 129 lb 12.8 oz (58.9 kg)    Fetal Status: Fetal Heart Rate (bpm): 161   Movement: Absent     General:  Alert, oriented and cooperative. Patient is in no acute distress.  Skin: Skin is warm and dry. No rash noted.   Cardiovascular: Normal heart rate and rhythm noted  Respiratory: Normal respiratory effort, no problems with respiration noted. Clear to auscultation.   Abdomen: Soft, gravid, appropriate for gestational age. Normal bowel sounds. Non-tender. Pain/Pressure: Present     Pelvic: Cervical exam performed Dilation: Closed Effacement (%): Thick   Normal cervical contour, no lesions, no bleeding following pap, moderate thin, white discharge  Extremities: Normal range of motion.  Edema: None  Mental Status: Normal mood and affect. Normal behavior. Normal judgment and thought content.   Assessment and Plan:  Pregnancy: G1P0 at [redacted]w[redacted]d 1. Encounter for supervision of low-risk pregnancy in second trimester - CBC/D/Plt+RPR+Rh+ABO+Rub Ab... - Culture, OB Urine -  Hemoglobin A1c - Cytology - PAP( [facility]) - Anatomy US scheduled tomorrow - Peds list given   - Declined genetic testing  2. Late prenatal care affecting pregnancy in second trimester - First visit at 19 weeks   3. [redacted] weeks gestation of pregnancy  Preterm labor/second trimester warning symptoms and general obstetric precautions including but not limited to vaginal bleeding, contractions, leaking of fluid and fetal movement were reviewed in detail with the patient. Please refer to After Visit Summary for other counseling recommendations.   Discussed the normal visit cadence for prenatal care Discussed the nature of our practice with multiple providers including residents and students   Return in about 4 weeks (around 01/30/2021) for LOB, In-Person.  Future Appointments  Date Time Provider Department Center  01/03/2021 10:45 AM [facility]    Vonzella Nipple, PA-C

## 2021-01-02 NOTE — Patient Instructions (Addendum)
AREA PEDIATRIC/FAMILY PRACTICE PHYSICIANS  [place]outheast [place] ([address]) . [facility] o Chambliss, MD; Eniola, MD; Hale, MD; Hensel, MD; McDiarmid, MD; McIntyer, MD; Neal, MD; Walden, MD o [address] [place], [place] [address] o [phone] o Mon-Fri 8:30-12:30, 1:30-5:00 o Providers come to see babies at [facility] o Accepting Medicaid . [facility] o Limited providers who accept newborns: Koirala, MD; Morrow, MD; Wolters, MD o [address], [place], [place] [address] o [phone] o Mon-Fri 8:00-5:30 o Babies seen by providers at [facility] o Does NOT accept Medicaid o Please call early in hospitalization for appointment (limited availability)  . [facility] o Mulberry, MD o [address] [place], [place] [address] o [phone] o Mon, Tue, Thur, Fri 8:30-5:00, Wed 10:00-7:00 (closed 1-2pm) o Babies seen by [facility] providers o Accepting Medicaid . [facility] - Pediatrician o Rubin, MD o [address], [place], [place] [address] o [phone] o Mon-Fri 8:30-5:00, Sat 8:30-12:00 o Provider comes to see babies at [facility] o Accepting Medicaid o Must have been referred from current patients or contacted office prior to delivery . [facility] ([facility]) o Brown, MD; Chandler, MD; Ettefagh, MD; Grant, MD; Lester, MD; McCormick, MD; McQueen, MD; Prose, MD; Simha, MD; Stanley, MD; Stryffeler, NP; Tebben, NP o [address], [place], [place] [address] o [phone] o Mon, Tue, Thur, Fri 8:30-5:30, Wed 9:30-5:30, Sat 8:30-12:30 o Babies seen by [facility] providers o Accepting Medicaid o Only accepting infants of first-time parents or siblings of current patients o Hospital discharge coordinator will make follow-up appointment . Jack Amos o [address],  [place], [place]  [address] o [phone]   Fax - [phone] . [facility] o [address], [place], [place]  [address] o Phone - [phone]   Fax - [phone] . Shilpa Gosrani o [address], [place], [place]  [address] o [phone]  [place] ([address]) . [facility] o Bates, MD; [place], MD; Cooper, Cox, MD; MD; Davis, MD; Dovico, MD; Ettefaugh, MD; Little, MD; Lowe, MD; Keiffer, MD; Melvin, MD; Sumner, MD; Williams, MD o [address], [place], [place] [address] o [phone] o Mon-Fri 8:30-5:00 (extended evenings Mon-Thur as needed), Sat-Sun 10:00-1:00 o Providers come to see babies at [facility] o Accepting Medicaid for families of first-time babies and families with all children in the household age 3 and under. Must register with office prior to making appointment (M-F only). . [facility] o Henson, NP; Knapp, MD; Lalonde, MD; Tysinger, PA o [address] [place], [place] [address] o [phone] o Mon-Fri 8:00-5:00 o Babies seen by providers at [facility] o Does NOT accept Medicaid/Commercial Insurance Only . [facility] ([facility]  o Artis, MD; Barnes, MD; Bratton, MD; Coccaro, MD; Lockett Gardner, MD; Kramer, MD; Marshall, MD; Netherton, MD; Poleto, MD; Skinner, MD o [address] [place], [place] [address] o [phone] o Mon-Fri 8:30-5:30, Sat (Oct.-Mar.) 9:00-1:00 o Babies seen by providers at [facility] o Accepting Medicaid  [place] ([address]) . [facility] o Reid, MD; Warner, MD o [address], [place],  [address] o [phone] o Mon-Fri 8:30-5:00, Sat 8:30-12:00 o Providers come to see babies at [facility] o Does NOT accept Medicaid . [facility] o Becker, PA; Hagler, MD; Scifres, PA; Sun, MD; Swayne, MD o [address],  [place], [place] [address] o [phone] o Mon-Fri 8:00-5:00 o Babies seen by providers at [facility] o Does NOT accept Medicaid o Only accepting babies of parents who are patients o Please call early in hospitalization for appointment (limited availability) . [facility] o Clark, MD; Frye, MD; Kelleher, MD; Mack, NP; Miller, MD; O'Keller, MD; Patterson, NP; Pudlo, MD; Puzio, MD; Thomas, MD; Tucker, MD; Twiselton, MD o [address], [place], [place] [address] o [phone] o Mon-Fri 8:00-5:00, Sat 9:00-12:00 o Providers come to see babies at [facility] o Does NOT accept Medicaid  [place] ([address]) . [facility] o Limited providers accepting new patients: Brake, NP; Wharton, PA o [address], [place], [place] [address] o [phone] o Mon-Fri 8:00-5:00 o Babies seen by providers at [facility] o Does NOT accept Medicaid o Only accepting babies of parents who are patients o Please call early in hospitalization for appointment (limited availability) . [facility] o Gay, MD; Quinlan, MD o [address] [place], [place] [address] o [phone] (press 1 to schedule appointment) o Mon-Fri 8:00-5:00 o Providers come to see babies at [facility] o Does NOT accept Medicaid . [facility] o Mazer, MD o [address] [place], [place] [address] o [phone] o Mon-Fri 8:30-5:00 (lunch 12:30-1:00), extended hours by appointment only Wed 5:00-6:30 o Babies seen by [facility] providers o Accepting Medicaid . [facility] o Banks, MD; Jordan, MD; Koberlein, MD o [address], [place], [place] [address] o [phone] o Mon-Fri 8:00-5:00 o Babies seen by [facility] providers o Does NOT accept Medicaid . [facility] o Parker, MD; Hunter, MD; Wallace, DO o [address] [place], [place]  [address] o [phone] o Mon-Fri 8:00-5:00 o Babies seen by [facility] providers o Does NOT accept Medicaid . [facility] o Brandon, PA; Brecken, PA; Christy, NP; Dees, MD; DeClaire, MD; DeWeese, MD; Hansen, NP; Mills, NP; Parrish, NP; Smoot, NP; Summer, MD; Vapne, MD o [address] [place], [place] [address] o [phone] o Mon-Fri 8:30-5:00, Sat 10:00-1:00 o Providers come to see babies at [facility] o Does NOT accept Medicaid o Free prenatal information session Tuesdays at 4:45pm . [facility] o Bouska, MD; Gordon, PA; Jeffery, PA; Weber, PA o [address] [place] [address] o [phone] o Mon-Fri 7:30-5:30 o Babies seen by [facility] providers . [facility] o [address], [place], [place], [place]  [address] o [phone]   Fax - [phone]  [place] ([phone]) . [facility] o Reese, MD o [address] [place], [place] [address] o [phone] o Mon-Thur 8:00-6:00 o Providers come to see babies at [facility] o Accepting Medicaid . [facility] o Anderson, NP; Badger, MD; Beal, PA; Spencer, PA o [address] [place],  [address] o [phone] o Mon-Thur 7:30-7:30, Fri 7:30-4:30 o Babies seen by [facility] providers o Accepting Medicaid . [facility] o Agbuya, MD; Klett, NP; Romgoolam, MD o [address], ,  [address] o [phone] o Mon-Fri 8:30-5:00, Sat 8:30-12:00 o Providers come to see babies at [facility] o Accepting Medicaid o Must have "Meet & Greet" appointment at office prior to delivery . [facility]  ([facility] ) o McCord,   MD; Juleen China, MD; Clydene Laming, [address], [place], [place] [address] o [phone] o Mon-Wed 8:00-6:00, Thur-Fri 8:00-5:00, Sat 9:00-12:00 o Providers come to  see babies at [facility] o Does NOT accept Medicaid o Only accepting siblings of current patients . [facility] [place], [place], [place], [place]  [address] o [phone]   Fax [phone] . [facility] [address], [place], [place]  [address] o [phone]   Fax - Morton Gorman [phone] & [phone]) . Therapist, music at [place], DO; [place], [address] [place], [place] [address] o [phone] o Mon-Fri 7:00-5:00 o Babies seen by [facility] providers o Does NOT accept Medicaid . [place], MD; [place], [place]; [place], [place], [place], [place] [address] o [phone] o Mon-Fri 8:00-5:00 o Babies seen by [facility] providers o Accepting Medicaid . Lamont, MD; [place], [place]; [place], NP; [place], [place], [place], [place] [address] o [phone] o Mon-Fri 8:00-5:00 o Babies seen by providers at [facility] [place] [phone]) . [facility], [place] o [address] [place], [place] [address] o [phone] o Mon-Fri 8:00-5:00 o Babies seen by [facility] providers o Does NOT accept Medicaid o Limited availability, please call early in hospitalization to schedule follow-up . [facility] Leilani Merl, PA; Maisie Fus, MD; [place], MD; [place], [place]; Jeannine Kitten, MD; [place], [place] [facility] [address], [place], [place] [address] o [phone] o Mon-Fri 8:30-5:00, Sat 9:00-12:00 o Babies seen by providers at [facility] o Accepting Medicaid o Please register online then schedule online or call office o [email or web address] . [place] [facility]  [place]) Kristian Covey, NP; Dwyane Dee, MD; Leonidas Romberg, PA o [address] [place], [place], [place] [address] o [phone] o Mon-Fri 8:00-5:00 o Babies seen by providers at [facility] o Accepting Medicaid . [place] ([facility]) Dairl Ponder, MD; Rayvon Char, NP; Melina Modena, MD o [address] [place], [place], [place] [address] o [phone] o Mon-Fri 8:00-5:30, Sat&Sun by appointment (phones open at 8:30) o Babies seen by [facility] providers o Accepting Medicaid o Must be a first-time baby or sibling of current patient . [place], [address], [place], [place]  [address] o [phone]   Fax - [phone]  [place] [phone] & [phone]) . [place], [place]; [place], [place]; Benjamine Mola, MD; [place], [place]; Harrell Lark, MD o [address] [place], [place] [address] o [phone] o Mon-Thur 8:00-7:00, Fri 8:00-5:00, Sat 8:00-12:00, Sun 9:00-12:00 o Babies seen by [facility] providers o Accepting Medicaid . [facility], MD; Ruthann Cancer, MD; [facility], MD o [address], [place], [place] [address] o [phone] o Mon-Thur 8:00-5:00 o Babies seen by providers at [facility] o Accepting Medicaid . [facility] [place], MD; Coe[place]ins, MD; Amedeo Plenty, MD; Bobby Rumpf, MD; List, MD; Lavonia Drafts, MD; Ruthann Cancer, MD; Selinda Eon, MD; Audie Box, MD; Jim Like, MD; Christie Nottingham, MD; Hubbard Hartshorn, MD; Modena Nunnery, MD o [address] [place], [place]  [address] o [phone] o Mon-Fri 8:00-5:30, Sat (Oct.-Mar.) 9:00-1:00 o Babies seen by providers at [facility] o Accepting Medicaid o Must fill out new patient packet, available online at [email or web address] . [place] ([facility]) Barnabas Lister, NP; Kenton Kingfisher, NP; Claiborne Billings, NP; Rolla Plate, MD;  [place], [place]; Carola Rhine, MD; Tyron Russell, MD; Delia Chimes, NP o [address], [place], [place] [address] o [phone] o Mon-Thur 8:00-5:30, Fri 8:00-5:00 o Babies seen by providers at [place] [phone]) . [place], [place]; [place], MD; Dennard Schaumann, MD; [place], [place] o [address] [place], [place] [address] o [phone] o Mon-Fri 8:00-5:00 o Babies seen by providers at [place] [phone]) . [place] [facility] [place], [place]; Olen Pel, MD; [place], [place], [place], [place] [address] o [phone] o Mon-Fri 8:00-5:00 o Babies seen by providers at [facility] o Does NOT accept Medicaid o Limited appointment availability, please call early in hospitalization  . Therapist, music at [place], [place]; [place], [address], [place], [place] [address] o [phone] o Mon-Fri 8:00-5:00 o Babies seen by [facility] providers o Does NOT accept Medicaid . [facility] Su Grand, MD; Guy Sandifer, MD; [place], [place]; [place], [place] [address], [place], [place] [address] o [phone] o Mon-Fri 8:00-5:00 o After hours clinic [facility] [place], [place] [address]) [phone] Mon-Fri 5:00-8:00, Sat 12:00-6:00, Sun 10:00-4:00 o Babies seen by [facility] providers o Accepting Medicaid . [place] [facility] o [address], [place], [place]  [address] o [phone]   Fax - [phone]  [place] [phone]) . Therapist, music at [facility], MD o [address] [place], [place], [place] [address] o [phone] o Mon-Fri 8:00-5:00 o Babies seen by [facility] providers o Does NOT accept Medicaid . [place] [facility]) Bing Neighbors, MD o [address] [place], [place],   [address] o [phone] o Mon-Thur 8:00-7:00, Fri 8:00-5:00, Sat 8:00-12:00 o Babies seen by providers at [facility] o Accepting Medicaid - but does not have vaccinations in office (must be received elsewhere) o Limited availability, please call early in hospitalization  [place] ([address]) . [place], MD o [address] [place] [address] o [phone]  Fax [phone]   Childbirth Education Options: [facility] Department Classes:  Childbirth education classes can help you get ready for a positive parenting experience. You can also meet other expectant parents and get free stuff for your baby. Each class runs for five weeks on the same night and costs $45 for the mother-to-be and her support person. Medicaid covers the cost if you are eligible. Call [phone] to register. [facility] Childbirth Education:  [phone] or [phone] or [email or web address]  Baby & Me Class: Discuss newborn & infant parenting and family adjustment issues with other new mothers in a relaxed environment. Each week brings a new speaker or baby-centered activity. We encourage new mothers to join [place] every Thursday at 11:00am. Babies birth until crawling. No registration or fee. Daddy [employer]: This course offers Dads-to-be the tools and knowledge needed to feel confident on their journey to becoming new fathers. Experienced dads, who have been trained as coaches, teach dads-to-be how  to hold, comfort, diaper, swaddle and play with their infant while being able to support the new mom as well. A class for men taught by men. $25/dad Big Brother/Big Sister: Let your children share in the joy of a new brother or sister in this special class designed just for them. Class includes discussion about how families care for babies: swaddling, holding, diapering, safety as well as how they can be helpful in their new role. This class is designed for  children ages 77 to 29, but any age is welcome. Please register each child individually. $5/child  Mom Talk: This mom-led group offers support and connection to mothers as they journey through the adjustments and struggles of that sometimes overwhelming first year after the birth of a child. Tuesdays at 10:00am and Thursdays at 6:00pm. Babies welcome. No registration or fee. Breastfeeding Support Group: This group is a mother-to-mother support circle where moms have the opportunity to share their breastfeeding experiences. A Lactation Consultant is present for questions and concerns. Meets each Tuesday at 11:00am. No fee or registration. Breastfeeding Your Baby: Learn what to expect in the first days of breastfeeding your newborn.  This class will help you feel more confident with the skills needed to begin your breastfeeding experience. Many new mothers are concerned about breastfeeding after leaving the hospital. This class will also address the most common fears and challenges about breastfeeding during the first few weeks, months and beyond. (call for fee) Comfort Techniques and Tour: This 2 hour interactive class will provide you the opportunity to learn & practice hands-on techniques that can help relieve some of the discomfort of labor and encourage your baby to rotate toward the best position for birth. You and your partner will be able to try a variety of labor positions with birth balls and rebozos as well as practice breathing, relaxation, and visualization techniques. A tour of the [facility] is included with this class. $20 per registrant and support person Childbirth Class- Weekend Option: This class is a Weekend version of our Birth & Baby series. It is designed for parents who have a difficult time fitting several weeks of classes into their schedule. It covers the care of your newborn and the basics of labor and childbirth. It also includes a [place]  [facility] and lunch. The class is held two consecutive days: beginning on Friday evening from 6:30 - 8:30 p.m. and the next day, Saturday from 9 a.m. - 4 p.m. (call for fee) Doren Custard Class: Interested in a waterbirth?  This informational class will help you discover whether waterbirth is the right fit for you. Education about waterbirth itself, supplies you would need and how to assemble your support team is what you can expect from this class. Some obstetrical practices require this class in order to pursue a waterbirth. (Not all obstetrical practices offer waterbirth-check with your healthcare provider.) Register only the expectant mom, but you are encouraged to bring your partner to class! Required if planning waterbirth, no fee. Infant/Child CPR: Parents, grandparents, babysitters, and friends learn Cardio-Pulmonary Resuscitation skills for infants and children. You will also learn how to treat both conscious and unconscious choking in infants and children. This Family & Friends program does not offer certification. Register each participant individually to ensure that enough mannequins are available. (Call for fee) Grandparent Love: Expecting a grandbaby? This class is for you! Learn about the latest infant care and safety recommendations and ways to support your own child  as he or she transitions into the parenting role. Taught by Registered Nurses who are childbirth instructors, but most importantly...they are grandmothers too! $10/person. Childbirth Class- Natural Childbirth: This series of 5 weekly classes is for expectant parents who want to learn and practice natural methods of coping with the process of labor and childbirth. Relaxation, breathing, massage, visualization, role of the partner, and helpful positioning are highlighted. Participants learn how to be confident in their body's ability to give birth. This class will empower and help parents make informed decisions about their own  care. Includes discussion that will help new parents transition into the immediate postpartum period. Maternity Care Center Tour of [facility] is included. We suggest taking this class between 25-32 weeks, but it's only a recommendation. $75 per registrant and one support person or $30 Medicaid. Childbirth Class- 3 week Series: This option of 3 weekly classes helps you and your labor partner prepare for childbirth. Newborn care, labor & birth, cesarean birth, pain management, and comfort techniques are discussed and a Maternity Care Center Tour of [facility] is included. The class meets at the same time, on the same day of the week for 3 consecutive weeks beginning with the starting date you choose. $60 for registrant and one support person.  Marvelous Multiples: Expecting twins, triplets, or more? This class covers the differences in labor, birth, parenting, and breastfeeding issues that face multiples' parents. NICU tour is included. Led by a Certified Childbirth Educator who is the mother of twins. No fee. Caring for Baby: This class is for expectant and adoptive parents who want to learn and practice the most up-to-date newborn care for their babies. Focus is on birth through the first six weeks of life. Topics include feeding, bathing, diapering, crying, umbilical cord care, circumcision care and safe sleep. Parents learn to recognize symptoms of illness and when to call the pediatrician. Register only the mom-to-be and your partner or support person can plan to come with you! $10 per registrant and support person Childbirth Class- online option: This online class offers you the freedom to complete a Birth and Baby series in the comfort of your own home. The flexibility of this option allows you to review sections at your own pace, at times convenient to you and your support people. It includes additional video information, animations, quizzes, and extended activities. Get organized with helpful  eClass tools, checklists, and trackers. Once you register online for the class, you will receive an email within a few days to accept the invitation and begin the class when the time is right for you. The content will be available to you for 60 days. $60 for 60 days of online access for you and your support people.        Safe Medications in Pregnancy   Acne:  Benzoyl Peroxide  Salicylic Acid   Backache/Headache:  Tylenol: 2 regular strength every 4 hours OR        2 Extra strength every 6 hours   Colds/Coughs/Allergies:  Benadryl (alcohol free) 25 mg every 6 hours as needed  Breath right strips  Claritin  Cepacol throat lozenges  Chloraseptic throat spray  Cold-Eeze- up to three times per day  Cough drops, alcohol free  Flonase (by prescription only)  Guaifenesin  Mucinex  Robitussin DM (plain only, alcohol free)  Saline nasal spray/drops  Sudafed (pseudoephedrine) & Actifed * use only after [redacted] weeks gestation and if you do not have high blood pressure  Tylenol  Vicks Vaporub  Zinc lozenges  Zyrtec   Constipation:  Colace  Ducolax suppositories  Fleet enema  Glycerin suppositories  Metamucil  Milk of magnesia  Miralax  Senokot  Smooth move tea   Diarrhea:  Kaopectate  Imodium A-D   *NO pepto Bismol   Hemorrhoids:  Anusol  Anusol HC  Preparation H  Tucks   Indigestion:  Tums  Maalox  Mylanta  Zantac  Pepcid   Insomnia:  Benadryl (alcohol free) 25mg  every 6 hours as needed  Tylenol PM  Unisom, no Gelcaps   Leg Cramps:  Tums  MagGel   Nausea/Vomiting:  Bonine  Dramamine  Emetrol  Ginger extract  Sea bands  Meclizine  Nausea medication to take during pregnancy:  Unisom (doxylamine succinate 25 mg tablets) Take one tablet daily at bedtime. If symptoms are not adequately controlled, the dose can be increased to a maximum recommended dose of two tablets daily (1/2 tablet in the morning, 1/2 tablet mid-afternoon and one at bedtime).   Vitamin B6 100mg  tablets. Take one tablet twice a day (up to 200 mg per day).   Skin Rashes:  Aveeno products  Benadryl cream or 25mg  every 6 hours as needed  Calamine Lotion  1% cortisone cream   Yeast infection:  Gyne-lotrimin 7  Monistat 7    **If taking multiple medications, please check labels to avoid duplicating the same active ingredients  **take medication as directed on the label  ** Do not exceed 4000 mg of tylenol in 24 hours  **Do not take medications that contain aspirin or ibuprofen

## 2021-01-03 ENCOUNTER — Other Ambulatory Visit: Payer: Self-pay | Admitting: Obstetrics and Gynecology

## 2021-01-03 ENCOUNTER — Ambulatory Visit: Payer: Medicaid Other | Attending: Medical

## 2021-01-03 DIAGNOSIS — Z363 Encounter for antenatal screening for malformations: Secondary | ICD-10-CM | POA: Diagnosis not present

## 2021-01-03 DIAGNOSIS — Z3A18 18 weeks gestation of pregnancy: Secondary | ICD-10-CM | POA: Diagnosis not present

## 2021-01-03 DIAGNOSIS — Z362 Encounter for other antenatal screening follow-up: Secondary | ICD-10-CM

## 2021-01-03 DIAGNOSIS — Z3A22 22 weeks gestation of pregnancy: Secondary | ICD-10-CM

## 2021-01-03 DIAGNOSIS — Z3490 Encounter for supervision of normal pregnancy, unspecified, unspecified trimester: Secondary | ICD-10-CM

## 2021-01-03 LAB — CYTOLOGY - PAP
Chlamydia: NEGATIVE
Comment: NEGATIVE
Comment: NORMAL
Diagnosis: NEGATIVE
Neisseria Gonorrhea: NEGATIVE

## 2021-01-03 LAB — CBC/D/PLT+RPR+RH+ABO+RUB AB...
Antibody Screen: NEGATIVE
Basophils Absolute: 0.1 10*3/uL (ref 0.0–0.2)
Basos: 0 %
EOS (ABSOLUTE): 0.1 10*3/uL (ref 0.0–0.4)
Eos: 0 %
HCV Ab: <0.1 s/co ratio (ref 0.0–0.9)
HIV Screen 4th Generation wRfx: NONREACTIVE
Hematocrit: 30.4 % — ABNORMAL LOW (ref 34.0–46.6)
Hemoglobin: 10.5 g/dL — ABNORMAL LOW (ref 11.1–15.9)
Hepatitis B Surface Ag: NEGATIVE
Immature Grans (Abs): 0 10*3/uL (ref 0.0–0.1)
Immature Granulocytes: 0 %
Lymphocytes Absolute: 1.5 10*3/uL (ref 0.7–3.1)
Lymphs: 13 %
MCH: 30.3 pg (ref 26.6–33.0)
MCHC: 34.5 g/dL (ref 31.5–35.7)
MCV: 88 fL (ref 79–97)
Monocytes Absolute: 0.6 10*3/uL (ref 0.1–0.9)
Monocytes: 5 %
Neutrophils Absolute: 9.4 10*3/uL — ABNORMAL HIGH (ref 1.4–7.0)
Neutrophils: 82 %
Platelets: 297 10*3/uL (ref 150–450)
RBC: 3.46 x10E6/uL — ABNORMAL LOW (ref 3.77–5.28)
RDW: 14.1 % (ref 11.7–15.4)
RPR Ser Ql: NONREACTIVE
Rh Factor: POSITIVE
Rubella Antibodies, IGG: 5.54 index (ref >0.99)
WBC: 11.7 10*3/uL — ABNORMAL HIGH (ref 3.4–10.8)

## 2021-01-03 LAB — HEMOGLOBIN A1C
Est. average glucose Bld gHb Est-mCnc: 85 mg/dL
Hgb A1c MFr Bld: 4.6 % — ABNORMAL LOW (ref 4.8–5.6)

## 2021-01-03 LAB — HCV INTERPRETATION

## 2021-01-05 LAB — URINE CULTURE, OB REFLEX

## 2021-01-05 LAB — CULTURE, OB URINE

## 2021-01-07 MED ORDER — CEPHALEXIN 500 MG PO CAPS: 500.0000 mg | ORAL_CAPSULE | Freq: Four times a day (QID) | ORAL | 0 refills | Status: DC

## 2021-01-07 NOTE — Addendum Note (Signed)
Addended by: Marny Lowenstein on: 01/07/2021 10:49 AM   Modules accepted: Orders

## 2021-01-31 ENCOUNTER — Other Ambulatory Visit: Payer: Self-pay

## 2021-01-31 ENCOUNTER — Ambulatory Visit (INDEPENDENT_AMBULATORY_CARE_PROVIDER_SITE_OTHER): Payer: Medicaid Other | Admitting: Obstetrics and Gynecology

## 2021-01-31 ENCOUNTER — Ambulatory Visit: Payer: Medicaid Other | Attending: Obstetrics and Gynecology

## 2021-01-31 VITALS — BP 104/57 | HR 74 | Wt 136.4 lb

## 2021-01-31 DIAGNOSIS — Z2821 Immunization not carried out because of patient refusal: Secondary | ICD-10-CM | POA: Insufficient documentation

## 2021-01-31 DIAGNOSIS — O0932 Supervision of pregnancy with insufficient antenatal care, second trimester: Secondary | ICD-10-CM | POA: Insufficient documentation

## 2021-01-31 DIAGNOSIS — Z362 Encounter for other antenatal screening follow-up: Secondary | ICD-10-CM | POA: Diagnosis not present

## 2021-01-31 DIAGNOSIS — Z3A22 22 weeks gestation of pregnancy: Secondary | ICD-10-CM | POA: Diagnosis not present

## 2021-01-31 DIAGNOSIS — Z3492 Encounter for supervision of normal pregnancy, unspecified, second trimester: Secondary | ICD-10-CM

## 2021-01-31 DIAGNOSIS — Z3A26 26 weeks gestation of pregnancy: Secondary | ICD-10-CM | POA: Insufficient documentation

## 2021-01-31 NOTE — Progress Notes (Signed)
   PRENATAL VISIT NOTE  Subjective:  Maria Ware is a 25 y.o. G1P0 at [redacted]w[redacted]d being seen today for ongoing prenatal care.  She is currently monitored for the following issues for this low-risk pregnancy and has COVID-19 affecting pregnancy in first trimester; Supervision of low-risk pregnancy; History of acute PID; Late prenatal care affecting pregnancy in second trimester; [redacted] weeks gestation of pregnancy; and COVID-19 vaccination declined on their problem list.  Patient doing well with no acute concerns today. She reports no complaints.  Contractions: Not present. Vag. Bleeding: None.  Movement: Present. Denies leaking of fluid.   The following portions of the patient's history were reviewed and updated as appropriate: allergies, current medications, past family history, past medical history, past social history, past surgical history and problem list. Problem list updated.  Objective:   Vitals:   01/31/21 1122  BP: (!) 104/57  Pulse: 74  Weight: 136 lb 6.4 oz (61.9 kg)    Fetal Status: Fetal Heart Rate (bpm): 156 Fundal Height: 22 cm Movement: Present     General:  Alert, oriented and cooperative. Patient is in no acute distress.  Skin: Skin is warm and dry. No rash noted.   Cardiovascular: Normal heart rate noted  Respiratory: Normal respiratory effort, no problems with respiration noted  Abdomen: Soft, gravid, appropriate for gestational age.  Pain/Pressure: Absent     Pelvic: Cervical exam deferred        Extremities: Normal range of motion.  Edema: None  Mental Status:  Normal mood and affect. Normal behavior. Normal judgment and thought content.   Assessment and Plan:  Pregnancy: G1P0 at [redacted]w[redacted]d  1. [redacted] weeks gestation of pregnancy   2. Late prenatal care affecting pregnancy in second trimester   3. Encounter for supervision of low-risk pregnancy in second trimester   4. COVID-19 vaccination declined   Preterm labor symptoms and general obstetric precautions  including but not limited to vaginal bleeding, contractions, leaking of fluid and fetal movement were reviewed in detail with the patient.  Please refer to After Visit Summary for other counseling recommendations.   Return in about 4 weeks (around 02/28/2021) for LOB, in person, 2 hr GTT, 3rd trim labs.   Mariel Aloe, MD [facility]ending [facility]

## 2021-01-31 NOTE — Progress Notes (Signed)
Patient filled out a High risk pregnancy form.

## 2021-01-31 NOTE — Patient Instructions (Signed)
Oral Glucose Tolerance Test During Pregnancy Why am I having this test? The oral glucose tolerance test (OGTT) is done to check how your body processes blood sugar (glucose). This is one of several tests used to diagnose diabetes that develops during pregnancy (gestational diabetes mellitus). Gestational diabetes is a short-term form of diabetes that some women develop while they are pregnant. It usually occurs during the second trimester of pregnancy and goes away after delivery. Testing, or screening, for gestational diabetes usually occurs at weeks 24-28 of pregnancy. You may have the OGTT test after having a 1-hour glucose screening test if the results from that test indicate that you may have gestational diabetes. This test may also be needed if:  You have a history of gestational diabetes.  There is a history of giving birth to very large babies or of losing pregnancies (having stillbirths).  You have signs and symptoms of diabetes, such as: ? Changes in your eyesight. ? Tingling or numbness in your hands or feet. ? Changes in hunger, thirst, and urination, and these are not explained by your pregnancy. What is being tested? This test measures the amount of glucose in your blood at different times during a period of 3 hours. This shows how well your body can process glucose. What kind of sample is taken? Blood samples are required for this test. They are usually collected by inserting a needle into a blood vessel.   How do I prepare for this test?  For 3 days before your test, eat normally. Have plenty of carbohydrate-rich foods.  Follow instructions from your health care provider about: ? Eating or drinking restrictions on the day of the test. You may be asked not to eat or drink anything other than water (to fast) starting 8-10 hours before the test. ? Changing or stopping your regular medicines. Some medicines may interfere with this test. Tell a health care provider about:  All  medicines you are taking, including vitamins, herbs, eye drops, creams, and over-the-counter medicines.  Any blood disorders you have.  Any surgeries you have had.  Any medical conditions you have. What happens during the test? First, your blood glucose will be measured. This is referred to as your fasting blood glucose because you fasted before the test. Then, you will drink a glucose solution that contains a certain amount of glucose. Your blood glucose will be measured again 1, 2, and 3 hours after you drink the solution. This test takes about 3 hours to complete. You will need to stay at the testing location during this time. During the testing period:  Do not eat or drink anything other than the glucose solution.  Do not exercise.  Do not use any products that contain nicotine or tobacco, such as cigarettes, e-cigarettes, and chewing tobacco. These can affect your test results. If you need help quitting, ask your health care provider. The testing procedure may vary among health care providers and hospitals. How are the results reported? Your results will be reported as milligrams of glucose per deciliter of blood (mg/dL) or millimoles per liter (mmol/L). There is more than one source for screening and diagnosis reference values used to diagnose gestational diabetes. Your health care provider will compare your results to normal values that were established after testing a large group of people (reference values). Reference values may vary among labs and hospitals. For this test (Carpenter-Coustan), reference values are:  Fasting: 95 mg/dL (5.3 mmol/L).  1 hour: 180 mg/dL (10.0 mmol/L).  2 hour:   155 mg/dL (8.6 mmol/L).  3 hour: 140 mg/dL (7.8 mmol/L). What do the results mean? Results below the reference values are considered normal. If two or more of your blood glucose levels are at or above the reference values, you may be diagnosed with gestational diabetes. If only one level is  high, your health care provider may suggest repeat testing or other tests to confirm a diagnosis. Talk with your health care provider about what your results mean. Questions to ask your health care provider Ask your health care provider, or the department that is doing the test:  When will my results be ready?  How will I get my results?  What are my treatment options?  What other tests do I need?  What are my next steps? Summary  The oral glucose tolerance test (OGTT) is one of several tests used to diagnose diabetes that develops during pregnancy (gestational diabetes mellitus). Gestational diabetes is a short-term form of diabetes that some women develop while they are pregnant.  You may have the OGTT test after having a 1-hour glucose screening test if the results from that test show that you may have gestational diabetes. You may also have this test if you have any symptoms or risk factors for this type of diabetes.  Talk with your health care provider about what your results mean. This information is not intended to replace advice given to you by your health care provider. Make sure you discuss any questions you have with your health care provider. Document Revised: 04/04/2020 Document Reviewed: 04/04/2020 Elsevier Patient Education  2021 Elsevier Inc.  

## 2021-02-03 ENCOUNTER — Encounter: Payer: Self-pay | Admitting: *Deleted

## 2021-02-05 ENCOUNTER — Encounter: Payer: Self-pay | Admitting: Medical

## 2021-02-05 ENCOUNTER — Encounter: Payer: Self-pay | Admitting: Lactation Services

## 2021-02-26 ENCOUNTER — Other Ambulatory Visit: Payer: Self-pay | Admitting: Lactation Services

## 2021-02-26 DIAGNOSIS — Z3492 Encounter for supervision of normal pregnancy, unspecified, second trimester: Secondary | ICD-10-CM

## 2021-02-28 ENCOUNTER — Encounter: Payer: Self-pay | Admitting: Certified Nurse Midwife

## 2021-02-28 ENCOUNTER — Ambulatory Visit (INDEPENDENT_AMBULATORY_CARE_PROVIDER_SITE_OTHER): Payer: Medicaid Other | Admitting: Certified Nurse Midwife

## 2021-02-28 ENCOUNTER — Other Ambulatory Visit: Payer: Self-pay

## 2021-02-28 ENCOUNTER — Other Ambulatory Visit: Payer: Medicaid Other

## 2021-02-28 VITALS — BP 102/70 | HR 87 | Wt 143.0 lb

## 2021-02-28 DIAGNOSIS — Z3402 Encounter for supervision of normal first pregnancy, second trimester: Secondary | ICD-10-CM

## 2021-02-28 DIAGNOSIS — Z3A26 26 weeks gestation of pregnancy: Secondary | ICD-10-CM

## 2021-02-28 NOTE — Progress Notes (Signed)
   PRENATAL VISIT NOTE  Subjective:  Maria Ware is a 25 y.o. G1P0 at [redacted]w[redacted]d being seen today for ongoing prenatal care.  She is currently monitored for the following issues for this low-risk pregnancy and has COVID-19 affecting pregnancy in first trimester; Supervision of low-risk pregnancy; History of acute PID; Late prenatal care affecting pregnancy in second trimester; [redacted] weeks gestation of pregnancy; and COVID-19 vaccination declined on their problem list.  Patient reports no complaints. Declined 2hr GTT, states she does not like consuming that much sugar in one sitting or the chemicals used in the glucose preparation. Also does not feel comfortable doing regular glucose screenings as she does not like blood draws that frequently. Reports a diet high in fruits, vegetables, meat and grains, does not regularly consume processed foods/drinks.  Contractions: Not present. Vag. Bleeding: None.  Movement: Present. Denies leaking of fluid.   The following portions of the patient's history were reviewed and updated as appropriate: allergies, current medications, past family history, past medical history, past social history, past surgical history and problem list.   Objective:   Vitals:   02/28/21 0849  BP: 102/70  Pulse: 87  Weight: 143 lb (64.9 kg)   Fetal Status: Fetal Heart Rate (bpm): 156 Fundal Height: 26 cm Movement: Present     General:  Alert, oriented and cooperative. Patient is in no acute distress.  Skin: Skin is warm and dry. No rash noted.   Cardiovascular: Normal heart rate noted  Respiratory: Normal respiratory effort, no problems with respiration noted  Abdomen: Soft, gravid, appropriate for gestational age.  Pain/Pressure: Absent     Pelvic: Cervical exam deferred        Extremities: Normal range of motion.  Edema: None  Mental Status: Normal mood and affect. Normal behavior. Normal judgment and thought content.   Assessment and Plan:  Pregnancy: G1P0 at [redacted]w[redacted]d 1.  Encounter for supervision of low-risk first pregnancy in second trimester - Doing well, feeling vigorous fetal movement  2. [redacted] weeks gestation of pregnancy - Routine OB care - Counseled extensively about importance of glucose screening. Discussed the role of the placenta in development of GDM, how GDM increases risk of gHTN, etc. Pt still declined all forms of screening except what was drawn in the first collection. Amenable to increased surveillance via ultrasound later in 3rd trimester. - Added CMP, A1C to 3rd trimester labs - Declined TDaP vaccination  Preterm labor symptoms and general obstetric precautions including but not limited to vaginal bleeding, contractions, leaking of fluid and fetal movement were reviewed in detail with the patient. Please refer to After Visit Summary for other counseling recommendations.   Return in about 2 weeks (around 03/14/2021) for IN-PERSON, LOB.  Future Appointments  Date Time Provider Department Center  02/28/2021  9:55 AM Osborne Oman [facility]  03/12/2021 10:15 AM Bernerd Limbo, CNM [facility]    Bernerd Limbo, CNM

## 2021-03-01 LAB — CBC
Hematocrit: 35.5 % (ref 34.0–46.6)
Hemoglobin: 12.4 g/dL (ref 11.1–15.9)
MCH: 31 pg (ref 26.6–33.0)
MCHC: 34.9 g/dL (ref 31.5–35.7)
MCV: 89 fL (ref 79–97)
Platelets: 270 10*3/uL (ref 150–450)
RBC: 4 x10E6/uL (ref 3.77–5.28)
RDW: 12.8 % (ref 11.7–15.4)
WBC: 11.2 10*3/uL — ABNORMAL HIGH (ref 3.4–10.8)

## 2021-03-01 LAB — HIV ANTIBODY (ROUTINE TESTING W REFLEX): HIV Screen 4th Generation wRfx: NONREACTIVE

## 2021-03-01 LAB — RPR: RPR Ser Ql: NONREACTIVE

## 2021-03-03 LAB — HEMOGLOBIN A1C
Est. average glucose Bld gHb Est-mCnc: 88 mg/dL
Hgb A1c MFr Bld: 4.7 % — ABNORMAL LOW (ref 4.8–5.6)

## 2021-03-03 LAB — COMPREHENSIVE METABOLIC PANEL
ALT: 11 IU/L (ref 0–32)
AST: 17 IU/L (ref 0–40)
Albumin/Globulin Ratio: 1.4 (ref 1.2–2.2)
Albumin: 3.8 g/dL — ABNORMAL LOW (ref 3.9–5.0)
Alkaline Phosphatase: 105 IU/L (ref 44–121)
BUN/Creatinine Ratio: 9 (ref 9–23)
BUN: 5 mg/dL — ABNORMAL LOW (ref 6–20)
Bilirubin Total: <0.2 mg/dL (ref 0.0–1.2)
CO2: 18 mmol/L — ABNORMAL LOW (ref 20–29)
Calcium: 9.1 mg/dL (ref 8.7–10.2)
Chloride: 102 mmol/L (ref 96–106)
Creatinine, Ser: 0.58 mg/dL (ref 0.57–1.00)
Globulin, Total: 2.7 g/dL (ref 1.5–4.5)
Glucose: 74 mg/dL (ref 65–99)
Potassium: 4.1 mmol/L (ref 3.5–5.2)
Sodium: 139 mmol/L (ref 134–144)
Total Protein: 6.5 g/dL (ref 6.0–8.5)
eGFR: 129 mL/min/{1.73_m2} (ref >59)

## 2021-03-12 ENCOUNTER — Encounter: Payer: Medicaid Other | Admitting: Certified Nurse Midwife

## 2021-03-13 ENCOUNTER — Other Ambulatory Visit: Payer: Self-pay

## 2021-03-13 ENCOUNTER — Encounter: Payer: Self-pay | Admitting: Family Medicine

## 2021-03-13 ENCOUNTER — Ambulatory Visit (INDEPENDENT_AMBULATORY_CARE_PROVIDER_SITE_OTHER): Payer: Medicaid Other | Admitting: Family Medicine

## 2021-03-13 VITALS — BP 113/71 | HR 84 | Wt 144.1 lb

## 2021-03-13 DIAGNOSIS — O0930 Supervision of pregnancy with insufficient antenatal care, unspecified trimester: Secondary | ICD-10-CM

## 2021-03-13 DIAGNOSIS — Z3490 Encounter for supervision of normal pregnancy, unspecified, unspecified trimester: Secondary | ICD-10-CM

## 2021-03-13 NOTE — Progress Notes (Signed)
   PRENATAL VISIT NOTE  Subjective:  Maria Ware is a 25 y.o. G2P0 at [redacted]w[redacted]d being seen today for ongoing prenatal care.  She is currently monitored for the following issues for this low-risk pregnancy and has COVID-19 affecting pregnancy in first trimester; Supervision of low-risk pregnancy; Late prenatal care affecting pregnancy in second trimester; and COVID-19 vaccination declined on their problem list.  Patient reports no complaints.  Contractions: Not present. Vag. Bleeding: None.  Movement: Present. Denies leaking of fluid.   The following portions of the patient's history were reviewed and updated as appropriate: allergies, current medications, past family history, past medical history, past social history, past surgical history and problem list.   Objective:   Vitals:   03/13/21 0848  BP: 113/71  Pulse: 84  Weight: 144 lb 1.6 oz (65.4 kg)    Fetal Status: Fetal Heart Rate (bpm): 157   Movement: Present     General:  Alert, oriented and cooperative. Patient is in no acute distress.  Skin: Skin is warm and dry. No rash noted.   Cardiovascular: Normal heart rate noted  Respiratory: Normal respiratory effort, no problems with respiration noted  Abdomen: Soft, gravid, appropriate for gestational age.  Pain/Pressure: Absent     Pelvic: Cervical exam deferred        Extremities: Normal range of motion.  Edema: None  Mental Status: Normal mood and affect. Normal behavior. Normal judgment and thought content.   Assessment and Plan:  Pregnancy: G2P0 at [redacted]w[redacted]d 1. Encounter for supervision of low-risk pregnancy, antepartum -doing well without complaints -taking PNV -discussed contraception, patient is still doing research and undecided at this time, referred to [email or web address].org for more information -declines all 28 week labs, discussed extensively risks to fetus including LGA, hypoglycemia, IUFD, gHTN etc. Please refer to Jamilla note from 02/28/21 for further discussion.  2.  Late prenatal care affecting pregnancy, antepartum Initial OB visit at 18 weeks. Does not meet criteria for postpartum SW consult.  Preterm labor symptoms and general obstetric precautions including but not limited to vaginal bleeding, contractions, leaking of fluid and fetal movement were reviewed in detail with the patient. Please refer to After Visit Summary for other counseling recommendations.   Return in about 4 weeks (around 04/10/2021) for LROB; in person.  Future Appointments  Date Time Provider Department Center  04/10/2021 10:55 AM Kathlene Cote [facility]    Alric Seton, MD

## 2021-04-10 ENCOUNTER — Encounter: Payer: Self-pay | Admitting: Medical

## 2021-04-10 ENCOUNTER — Ambulatory Visit (INDEPENDENT_AMBULATORY_CARE_PROVIDER_SITE_OTHER): Payer: Medicaid Other | Admitting: Medical

## 2021-04-10 ENCOUNTER — Other Ambulatory Visit: Payer: Self-pay

## 2021-04-10 VITALS — BP 108/69 | HR 108 | Wt 147.8 lb

## 2021-04-10 DIAGNOSIS — Z3493 Encounter for supervision of normal pregnancy, unspecified, third trimester: Secondary | ICD-10-CM

## 2021-04-10 DIAGNOSIS — Z3A32 32 weeks gestation of pregnancy: Secondary | ICD-10-CM

## 2021-04-10 DIAGNOSIS — U071 COVID-19: Secondary | ICD-10-CM

## 2021-04-10 DIAGNOSIS — O0932 Supervision of pregnancy with insufficient antenatal care, second trimester: Secondary | ICD-10-CM

## 2021-04-10 DIAGNOSIS — O98511 Other viral diseases complicating pregnancy, first trimester: Secondary | ICD-10-CM

## 2021-04-10 NOTE — Progress Notes (Signed)
   PRENATAL VISIT NOTE  Subjective:  Maria Ware is a 25 y.o. G1P0 at [redacted]w[redacted]d being seen today for ongoing prenatal care.  She is currently monitored for the following issues for this low-risk pregnancy and has COVID-19 affecting pregnancy in first trimester; Supervision of low-risk pregnancy; Late prenatal care affecting pregnancy in second trimester; and COVID-19 vaccination declined on their problem list.  Patient reports occasional contractions.  Contractions: Irritability. Vag. Bleeding: None.  Movement: Present. Denies leaking of fluid.   The following portions of the patient's history were reviewed and updated as appropriate: allergies, current medications, past family history, past medical history, past social history, past surgical history and problem list.   Objective:   Vitals:   04/10/21 1115  BP: 108/69  Pulse: (!) 108  Weight: 147 lb 12.8 oz (67 kg)    Fetal Status: Fetal Heart Rate (bpm): 152 Fundal Height: 33 cm Movement: Present     General:  Alert, oriented and cooperative. Patient is in no acute distress.  Skin: Skin is warm and dry. No rash noted.   Cardiovascular: Normal heart rate noted  Respiratory: Normal respiratory effort, no problems with respiration noted  Abdomen: Soft, gravid, appropriate for gestational age.  Pain/Pressure: Present     Pelvic: Cervical exam deferred        Extremities: Normal range of motion.  Edema: None Small prominent varicosity of the left calf, without erythema or discoloration. Non-tender. Lower legs are symmetric. Negative Homan's sign.   Mental Status: Normal mood and affect. Normal behavior. Normal judgment and thought content.   Assessment and Plan:  Pregnancy: G1P0 at [redacted]w[redacted]d 1. Encounter for supervision of low-risk pregnancy in third trimester - Doing well - Has chosen peds, will bring name to next visit  - Planning condom use for contraception only   2. Late prenatal care affecting pregnancy in second  trimester  3. COVID-19 affecting pregnancy in first trimester  4. Varicosity, left leg - Warning signs for DVT discussed   5. [redacted] weeks gestation of pregnancy   Preterm labor symptoms and general obstetric precautions including but not limited to vaginal bleeding, contractions, leaking of fluid and fetal movement were reviewed in detail with the patient. Please refer to After Visit Summary for other counseling recommendations.   Return in about 2 weeks (around 04/24/2021) for LOB, any provider, Virtual.  Future Appointments  Date Time Provider Department Center  04/24/2021 10:55 AM Marsala, Arlana Pouch, MD [facility]    Vonzella Nipple, PA-C

## 2021-04-10 NOTE — Patient Instructions (Signed)
Fetal Movement Counts Patient Name: ________________________________________________ Patient Due Date: ____________________  What is a fetal movement count? A fetal movement count is the number of times that you feel your baby move during a certain amount of time. This may also be called a fetal kick count. A fetal movement count is recommended for every pregnant woman. You may be asked to start counting fetal movements as early as week 28 of your pregnancy. Pay attention to when your baby is most active. You may notice your baby's sleep and wake cycles. You may also notice things that make your baby move more. You should do a fetal movement count:  When your baby is normally most active.  At the same time each day. A good time to count movements is while you are resting, after having something to eat and drink. How do I count fetal movements? 1. Find a quiet, comfortable area. Sit, or lie down on your side. 2. Write down the date, the start time and stop time, and the number of movements that you felt between those two times. Take this information with you to your health care visits. 3. Write down your start time when you feel the first movement. 4. Count kicks, flutters, swishes, rolls, and jabs. You should feel at least 10 movements. 5. You may stop counting after you have felt 10 movements, or if you have been counting for 2 hours. Write down the stop time. 6. If you do not feel 10 movements in 2 hours, contact your health care provider for further instructions. Your health care provider may want to do additional tests to assess your baby's well-being. Contact a health care provider if:  You feel fewer than 10 movements in 2 hours.  Your baby is not moving like he or she usually does. Date: ____________ Start time: ____________ Stop time: ____________ Movements: ____________ Date: ____________ Start time: ____________ Stop time: ____________ Movements: ____________ Date: ____________  Start time: ____________ Stop time: ____________ Movements: ____________ Date: ____________ Start time: ____________ Stop time: ____________ Movements: ____________ Date: ____________ Start time: ____________ Stop time: ____________ Movements: ____________ Date: ____________ Start time: ____________ Stop time: ____________ Movements: ____________ Date: ____________ Start time: ____________ Stop time: ____________ Movements: ____________ Date: ____________ Start time: ____________ Stop time: ____________ Movements: ____________ Date: ____________ Start time: ____________ Stop time: ____________ Movements: ____________ This information is not intended to replace advice given to you by your health care provider. Make sure you discuss any questions you have with your health care provider. Document Revised: 06/15/2019 Document Reviewed: 06/15/2019 Elsevier Patient Education  2021 Elsevier Inc. Rosen's Emergency Medicine: Concepts and Clinical Practice (9th ed., pp. 2296- 2312). Elsevier.">    Braxton Hicks Contractions Contractions of the uterus can occur throughout pregnancy, but they are not always a sign that you are in labor. You may have practice contractions called Braxton Hicks contractions. These false labor contractions are sometimes confused with true labor. What are Braxton Hicks contractions? Braxton Hicks contractions are tightening movements that occur in the muscles of the uterus before labor. Unlike true labor contractions, these contractions do not result in opening (dilation) and thinning of the cervix. Toward the end of pregnancy (32-34 weeks), Braxton Hicks contractions can happen more often and may become stronger. These contractions are sometimes difficult to tell apart from true labor because they can be very uncomfortable. You should not feel embarrassed if you go to the hospital with false labor. Sometimes, the only way to tell if you are in true labor is   for your health care  provider to look for changes in the cervix. The health care provider will do a physical exam and may monitor your contractions. If you are not in true labor, the exam should show that your cervix is not dilating and your water has not broken. If there are no other health problems associated with your pregnancy, it is completely safe for you to be sent home with false labor. You may continue to have Braxton Hicks contractions until you go into true labor. How to tell the difference between true labor and false labor True labor  Contractions last 30-70 seconds.  Contractions become very regular.  Discomfort is usually felt in the top of the uterus, and it spreads to the lower abdomen and low back.  Contractions do not go away with walking.  Contractions usually become more intense and increase in frequency.  The cervix dilates and gets thinner. False labor  Contractions are usually shorter and not as strong as true labor contractions.  Contractions are usually irregular.  Contractions are often felt in the front of the lower abdomen and in the groin.  Contractions may go away when you walk around or change positions while lying down.  Contractions get weaker and are shorter-lasting as time goes on.  The cervix usually does not dilate or become thin. Follow these instructions at home:  Take over-the-counter and prescription medicines only as told by your health care provider.  Keep up with your usual exercises and follow other instructions from your health care provider.  Eat and drink lightly if you think you are going into labor.  If Braxton Hicks contractions are making you uncomfortable: ? Change your position from lying down or resting to walking, or change from walking to resting. ? Sit and rest in a tub of warm water. ? Drink enough fluid to keep your urine pale yellow. Dehydration may cause these contractions. ? Do slow and deep breathing several times an hour.  Keep  all follow-up prenatal visits as told by your health care provider. This is important.   Contact a health care provider if:  You have a fever.  You have continuous pain in your abdomen. Get help right away if:  Your contractions become stronger, more regular, and closer together.  You have fluid leaking or gushing from your vagina.  You pass blood-tinged mucus (bloody show).  You have bleeding from your vagina.  You have low back pain that you never had before.  You feel your baby's head pushing down and causing pelvic pressure.  Your baby is not moving inside you as much as it used to. Summary  Contractions that occur before labor are called Braxton Hicks contractions, false labor, or practice contractions.  Braxton Hicks contractions are usually shorter, weaker, farther apart, and less regular than true labor contractions. True labor contractions usually become progressively stronger and regular, and they become more frequent.  Manage discomfort from Braxton Hicks contractions by changing position, resting in a warm bath, drinking plenty of water, or practicing deep breathing. This information is not intended to replace advice given to you by your health care provider. Make sure you discuss any questions you have with your health care provider. Document Revised: 10/08/2017 Document Reviewed: 03/11/2017 Elsevier Patient Education  2021 Elsevier Inc.  

## 2021-04-10 NOTE — Progress Notes (Signed)
Patient is feeling fine

## 2021-04-24 ENCOUNTER — Encounter: Payer: Self-pay | Admitting: Obstetrics and Gynecology

## 2021-04-24 ENCOUNTER — Telehealth (INDEPENDENT_AMBULATORY_CARE_PROVIDER_SITE_OTHER): Payer: Medicaid Other | Admitting: Obstetrics and Gynecology

## 2021-04-24 VITALS — BP 112/69

## 2021-04-24 DIAGNOSIS — Z3A34 34 weeks gestation of pregnancy: Secondary | ICD-10-CM

## 2021-04-24 DIAGNOSIS — Z3493 Encounter for supervision of normal pregnancy, unspecified, third trimester: Secondary | ICD-10-CM

## 2021-04-24 DIAGNOSIS — O0932 Supervision of pregnancy with insufficient antenatal care, second trimester: Secondary | ICD-10-CM

## 2021-04-24 DIAGNOSIS — O0933 Supervision of pregnancy with insufficient antenatal care, third trimester: Secondary | ICD-10-CM

## 2021-04-24 NOTE — Progress Notes (Signed)
    TELEHEALTH OBSTETRICS VISIT ENCOUNTER NOTE  Provider location: Center for [facility] at [facility]   Patient location: Home  I connected with Maria Ware on 04/24/21 at 10:55 AM EDT by telephone at home and verified that I am speaking with the correct person using two identifiers. Of note, unable to do video encounter due to technical difficulties.    I discussed the limitations, risks, security and privacy concerns of performing an evaluation and management service by telephone and the availability of in person appointments. I also discussed with the patient that there may be a patient responsible charge related to this service. The patient expressed understanding and agreed to proceed.  Subjective:  Maria Ware is a 25 y.o. G1P0 at [redacted]w[redacted]d being followed for ongoing prenatal care.  She is currently monitored for the following issues for this low-risk pregnancy and has COVID-19 affecting pregnancy in first trimester; Supervision of low-risk pregnancy; Late prenatal care affecting pregnancy in second trimester; and COVID-19 vaccination declined on their problem list.  Patient reports no complaints. Reports fetal movement. Denies any contractions, bleeding or leaking of fluid.   The following portions of the patient's history were reviewed and updated as appropriate: allergies, current medications, past family history, past medical history, past social history, past surgical history and problem list.   Objective:  Blood pressure 112/69, last menstrual period 08/27/2020, unknown if currently breastfeeding. General:  Alert, oriented and cooperative.   Mental Status: Normal mood and affect perceived. Normal judgment and thought content.  Rest of physical exam deferred due to type of encounter  Assessment and Plan:  Pregnancy: G1P0 at [redacted]w[redacted]d 1. Encounter for supervision of low-risk pregnancy in third trimester No concerns, doing well overall.   2. Late  prenatal care affecting pregnancy in second trimester   3. [redacted] weeks gestation of pregnancy -discussed GBS at next visit   Preterm labor symptoms and general obstetric precautions including but not limited to vaginal bleeding, contractions, leaking of fluid and fetal movement were reviewed in detail with the patient.  I discussed the assessment and treatment plan with the patient. The patient was provided an opportunity to ask questions and all were answered. The patient agreed with the plan and demonstrated an understanding of the instructions. The patient was advised to call back or seek an in-person office evaluation/go to MAU at [facility] for any urgent or concerning symptoms. Please refer to After Visit Summary for other counseling recommendations.     Return in about 2 weeks (around 05/08/2021) for OB in person .  No future appointments.  Gita Kudo, MD Center for [facility], [facility]

## 2021-04-24 NOTE — Progress Notes (Signed)
I connected with  Maria Ware on 04/24/21 at 10:55 AM EDT by MyChart Virtual Video Visit and verified that I am speaking with the correct person using two identifiers.   I discussed the limitations, risks, security and privacy concerns of performing an evaluation and management service by telephone and the availability of in person appointments. I also discussed with the patient that there may be a patient responsible charge related to this service. The patient expressed understanding and agreed to proceed.  Guy Begin, CMA 04/24/2021  11:18 AM

## 2021-05-15 ENCOUNTER — Other Ambulatory Visit (HOSPITAL_COMMUNITY)
Admission: RE | Admit: 2021-05-15 | Discharge: 2021-05-15 | Disposition: A | Discharge status: Home / Self Care | Payer: Medicaid Other | Source: Ambulatory Visit | Attending: Obstetrics and Gynecology | Admitting: Obstetrics and Gynecology

## 2021-05-15 ENCOUNTER — Other Ambulatory Visit: Payer: Self-pay

## 2021-05-15 ENCOUNTER — Encounter: Payer: Self-pay | Admitting: Obstetrics and Gynecology

## 2021-05-15 ENCOUNTER — Ambulatory Visit (INDEPENDENT_AMBULATORY_CARE_PROVIDER_SITE_OTHER): Payer: Medicaid Other | Admitting: Obstetrics and Gynecology

## 2021-05-15 VITALS — BP 108/79 | HR 84 | Wt 152.7 lb

## 2021-05-15 DIAGNOSIS — Z3493 Encounter for supervision of normal pregnancy, unspecified, third trimester: Secondary | ICD-10-CM

## 2021-05-15 DIAGNOSIS — Z3A37 37 weeks gestation of pregnancy: Secondary | ICD-10-CM | POA: Insufficient documentation

## 2021-05-15 NOTE — Progress Notes (Signed)
   PRENATAL VISIT NOTE  Subjective:  Maria Ware is a 25 y.o. G1P0 at [redacted]w[redacted]d being seen today for ongoing prenatal care.  She is currently monitored for the following issues for this low-risk pregnancy and has COVID-19 affecting pregnancy in first trimester; Supervision of low-risk pregnancy; Late prenatal care affecting pregnancy in second trimester; COVID-19 vaccination declined; and [redacted] weeks gestation of pregnancy on their problem list.  Patient doing well with no acute concerns today. She reports no complaints.  Contractions: Irritability. Vag. Bleeding: None.  Movement: Present. Denies leaking of fluid.   The following portions of the patient's history were reviewed and updated as appropriate: allergies, current medications, past family history, past medical history, past social history, past surgical history and problem list. Problem list updated.  Objective:   Vitals:   05/15/21 1332  BP: 108/79  Pulse: 84  Weight: 152 lb 11.2 oz (69.3 kg)    Fetal Status: Fetal Heart Rate (bpm): 154 Fundal Height: 37 cm Movement: Present     General:  Alert, oriented and cooperative. Patient is in no acute distress.  Skin: Skin is warm and dry. No rash noted.   Cardiovascular: Normal heart rate noted  Respiratory: Normal respiratory effort, no problems with respiration noted  Abdomen: Soft, gravid, appropriate for gestational age.  Pain/Pressure: Present     Pelvic: Cervical exam deferred        Extremities: Normal range of motion.  Edema: None  Mental Status:  Normal mood and affect. Normal behavior. Normal judgment and thought content.   Assessment and Plan:  Pregnancy: G1P0 at [redacted]w[redacted]d  1. Encounter for supervision of low-risk pregnancy in third trimester Continue routine care - Culture, beta strep (group b only) - Cervicovaginal ancillary only( [place])  2. [redacted] weeks gestation of pregnancy   Term labor symptoms and general obstetric precautions including but not limited to  vaginal bleeding, contractions, leaking of fluid and fetal movement were reviewed in detail with the patient.  Please refer to After Visit Summary for other counseling recommendations.   Return in about 1 week (around 05/22/2021) for LOB, in person.   Mariel Aloe, MD Faculty Attending Center for [facility]

## 2021-05-15 NOTE — Progress Notes (Signed)
Patient complains of "dull" pain in abdomen. Stated that the pain would come and go, along with lower back and started around 6 days ago

## 2021-05-18 LAB — CERVICOVAGINAL ANCILLARY ONLY
Chlamydia: NEGATIVE
Comment: NEGATIVE
Comment: NORMAL
Neisseria Gonorrhea: NEGATIVE

## 2021-05-20 LAB — CULTURE, BETA STREP (GROUP B ONLY): Strep Gp B Culture: NEGATIVE

## 2021-05-26 ENCOUNTER — Encounter: Payer: Self-pay | Admitting: Obstetrics and Gynecology

## 2021-05-26 ENCOUNTER — Other Ambulatory Visit: Payer: Self-pay

## 2021-05-26 ENCOUNTER — Ambulatory Visit (INDEPENDENT_AMBULATORY_CARE_PROVIDER_SITE_OTHER): Payer: Medicaid Other | Admitting: Obstetrics and Gynecology

## 2021-05-26 VITALS — BP 120/72 | HR 97 | Wt 157.0 lb

## 2021-05-26 DIAGNOSIS — Z3493 Encounter for supervision of normal pregnancy, unspecified, third trimester: Secondary | ICD-10-CM

## 2021-05-26 NOTE — Progress Notes (Signed)
Subjective:  Maria Ware is a 25 y.o. G1P0 at [redacted]w[redacted]d being seen today for ongoing prenatal care.  She is currently monitored for the following issues for this low-risk pregnancy and has COVID-19 affecting pregnancy in first trimester; Supervision of low-risk pregnancy; Late prenatal care affecting pregnancy in second trimester; and COVID-19 vaccination declined on their problem list.  Patient reports general discomforts of pregnancy.  Contractions: Not present. Vag. Bleeding: None.  Movement: Present. Denies leaking of fluid.   The following portions of the patient's history were reviewed and updated as appropriate: allergies, current medications, past family history, past medical history, past social history, past surgical history and problem list. Problem list updated.  Objective:   Vitals:   05/26/21 1525  BP: 120/72  Pulse: 97  Weight: 157 lb (71.2 kg)    Fetal Status: Fetal Heart Rate (bpm): 156   Movement: Present     General:  Alert, oriented and cooperative. Patient is in no acute distress.  Skin: Skin is warm and dry. No rash noted.   Cardiovascular: Normal heart rate noted  Respiratory: Normal respiratory effort, no problems with respiration noted  Abdomen: Soft, gravid, appropriate for gestational age. Pain/Pressure: Present     Pelvic:  Cervical exam deferred        Extremities: Normal range of motion.  Edema: None  Mental Status: Normal mood and affect. Normal behavior. Normal judgment and thought content.   Urinalysis:      Assessment and Plan:  Pregnancy: G1P0 at [redacted]w[redacted]d  1. Encounter for supervision of low-risk pregnancy in third trimester Stable Labor precautions  Term labor symptoms and general obstetric precautions including but not limited to vaginal bleeding, contractions, leaking of fluid and fetal movement were reviewed in detail with the patient. Please refer to After Visit Summary for other counseling recommendations.  Return in about 1 week  (around 06/02/2021) for OB visit, face to face, any provider.   Hermina Staggers, MD

## 2021-05-26 NOTE — Patient Instructions (Signed)
Vaginal Delivery ?Vaginal delivery means that you give birth by pushing your baby out of your birth canal (vagina). Your health care team will help you before, during, and after vaginal delivery. ?Birth experiences are unique for every woman and every pregnancy, and birth experiences vary depending on where you choose to give birth. ?What are the risks and benefits? ?Generally, this is safe. However, problems may occur, including: ?Bleeding. ?Infection. ?Damage to other structures such as vaginal tearing. ?Allergic reactions to medicines. ?Despite the risks, benefits of vaginal delivery include less risk of bleeding and infection and a shorter recovery time compared to a Cesarean delivery. Cesarean delivery, or C-section, is the surgical delivery of a baby. ?What happens when I arrive at the birth center or hospital? ?Once you are in labor and have been admitted into the hospital or birth center, your health care team may: ?Review your pregnancy history and any concerns that you have. ?Talk with you about your birth plan and discuss pain control options. ?Check your blood pressure, breathing, and heartbeat. ?Assess your baby's heartbeat. ?Monitor your uterus for contractions. ?Check whether your bag of water (amniotic sac) has broken (ruptured). ?Insert an IV into one of your veins. This may be used to give you fluids and medicines. ?Monitoring ?Your health care team may assess your contractions (uterine monitoring) and your baby's heart rate (fetal monitoring). You may need to be monitored: ?Often, but not continuously (intermittently). ?All the time or for long periods at a time (continuously). Continuous monitoring may be needed if: ?You are taking certain medicines, such as medicine to relieve pain or make your contractions stronger. ?You have pregnancy or labor complications. ?Monitoring may be done by: ?Placing a special stethoscope or a handheld monitoring device on your abdomen to check your baby's heartbeat  and to check for contractions. ?Placing monitors on your abdomen (external monitors) to record your baby's heartbeat and the frequency and length of contractions. ?Placing monitors inside your uterus through your vagina (internal monitors) to record your baby's heartbeat and the frequency, length, and strength of your contractions. Depending on the type of monitor, it may remain in your uterus or on your baby's head until birth. ?Telemetry. This is a type of continuous monitoring that can be done with external or internal monitors. Instead of having to stay in bed, you are able to move around. ?Physical exam ?Your health care team may perform frequent physical exams. This may include: ?Checking how and where your baby is positioned in your uterus. ?Checking your cervix to determine: ?Whether it is thinning out (effacing). ?Whether it is opening up (dilating). ?What happens during labor and delivery? ?Normal labor and delivery is divided into the following three stages: ?Stage 1 ?This is the longest stage of labor. ?Throughout this stage, you will feel contractions. Contractions generally feel mild, infrequent, and irregular at first. They get stronger, more frequent, and more regular as you move through this stage. You may have contractions about every 2-3 minutes. ?This stage ends when your cervix is completely dilated to 4 inches (10 cm) and completely effaced. ?Stage 2 ?This stage starts once your cervix is completely effaced and dilated and lasts until the delivery of your baby. ?This is the stage where you will feel an urge to push your baby out of your vagina. ?You may feel stretching and burning pain, especially when the widest part of your baby's head passes through the vaginal opening (crowning). ?Once your baby is delivered, the umbilical cord will be clamped and   cut. Timing of cutting the cord will depend on your wishes, your baby's health, and your health care provider's practices. ?Your baby will be  placed on your bare chest (skin-to-skin contact) in an upright position and covered with a warm blanket. If you are choosing to breastfeed, watch your baby for feeding cues, like rooting or sucking, and help the baby to your breast for his or her first feeding. ?Stage 3 ?This stage starts immediately after the birth of your baby and ends after you deliver the placenta. ?This stage may take anywhere from 5 to 30 minutes. ?After your baby has been delivered, you will feel contractions as your body expels the placenta. These contractions also help your uterus get smaller and reduce bleeding. ?What can I expect after labor and delivery? ?After labor is over, you and your baby will be assessed closely until you are ready to go home. Your health care team will teach you how to care for yourself and your baby. ?You and your baby may be encouraged to stay in the same room (rooming in) during your hospital stay. This will help promote early bonding and successful breastfeeding. ?Your uterus will be checked and massaged regularly (fundal massage). ?You may continue to receive fluids and medicines through an IV. ?You will have some soreness and pain in your abdomen, vagina, and the area of skin between your vaginal opening and your anus (perineum). ?If an incision was made near your vagina (episiotomy) or if you had some vaginal tearing during delivery, cold compresses may be placed on your episiotomy or your tear. This helps to reduce pain and swelling. ?It is normal to have vaginal bleeding after delivery. Wear a sanitary pad for vaginal bleeding and discharge. ?Summary ?Vaginal delivery means that you will give birth by pushing your baby out of your birth canal (vagina). ?Your health care team will monitor you and your baby throughout the stages of labor. ?After you deliver your baby, your health care team will continue to assess you and your baby to ensure you are both recovering as expected after delivery. ?This  information is not intended to replace advice given to you by your health care provider. Make sure you discuss any questions you have with your health care provider. ?Document Revised: 09/23/2020 Document Reviewed: 09/23/2020 ?Elsevier Patient Education ? 2022 Elsevier Inc[employer] ? ?

## 2021-05-28 ENCOUNTER — Other Ambulatory Visit: Payer: Self-pay

## 2021-05-28 ENCOUNTER — Inpatient Hospital Stay (EMERGENCY_DEPARTMENT_HOSPITAL)
Admission: AD | Admit: 2021-05-28 | Discharge: 2021-05-29 | Disposition: A | Discharge status: Home / Self Care | Payer: Medicaid Other | Source: Home / Self Care | Attending: Obstetrics and Gynecology | Admitting: Obstetrics and Gynecology

## 2021-05-28 DIAGNOSIS — Z331 Pregnant state, incidental: Secondary | ICD-10-CM

## 2021-05-28 DIAGNOSIS — Z3A39 39 weeks gestation of pregnancy: Secondary | ICD-10-CM | POA: Insufficient documentation

## 2021-05-28 DIAGNOSIS — O471 False labor at or after 37 completed weeks of gestation: Secondary | ICD-10-CM | POA: Insufficient documentation

## 2021-05-28 DIAGNOSIS — O479 False labor, unspecified: Secondary | ICD-10-CM

## 2021-05-28 NOTE — MAU Note (Signed)
Ctxs since 1830. Noticed has some wetness to panties an hour ago but has not leaked anymore fld. No recent sve. Denies VB

## 2021-05-29 ENCOUNTER — Encounter (HOSPITAL_COMMUNITY): Payer: Self-pay | Admitting: Obstetrics and Gynecology

## 2021-05-29 ENCOUNTER — Inpatient Hospital Stay (HOSPITAL_COMMUNITY): Payer: Medicaid Other | Admitting: Anesthesiology

## 2021-05-29 ENCOUNTER — Inpatient Hospital Stay (HOSPITAL_COMMUNITY)
Admission: AD | Admit: 2021-05-29 | Discharge: 2021-05-31 | DRG: 806 | Disposition: A | Discharge status: Home / Self Care | Payer: Medicaid Other | Attending: Obstetrics and Gynecology | Admitting: Obstetrics and Gynecology

## 2021-05-29 DIAGNOSIS — Z87891 Personal history of nicotine dependence: Secondary | ICD-10-CM | POA: Diagnosis not present

## 2021-05-29 DIAGNOSIS — O4202 Full-term premature rupture of membranes, onset of labor within 24 hours of rupture: Secondary | ICD-10-CM | POA: Diagnosis not present

## 2021-05-29 DIAGNOSIS — Z3A39 39 weeks gestation of pregnancy: Secondary | ICD-10-CM

## 2021-05-29 DIAGNOSIS — O26893 Other specified pregnancy related conditions, third trimester: Secondary | ICD-10-CM | POA: Diagnosis present

## 2021-05-29 DIAGNOSIS — Z20822 Contact with and (suspected) exposure to covid-19: Secondary | ICD-10-CM | POA: Diagnosis present

## 2021-05-29 DIAGNOSIS — Z8616 Personal history of COVID-19: Secondary | ICD-10-CM | POA: Diagnosis not present

## 2021-05-29 DIAGNOSIS — N390 Urinary tract infection, site not specified: Secondary | ICD-10-CM | POA: Diagnosis present

## 2021-05-29 DIAGNOSIS — O479 False labor, unspecified: Secondary | ICD-10-CM

## 2021-05-29 LAB — RESP PANEL BY RT-PCR (FLU A&B, COVID) ARPGX2
Influenza A by PCR: NEGATIVE
Influenza B by PCR: NEGATIVE
SARS Coronavirus 2 by RT PCR: NEGATIVE

## 2021-05-29 LAB — CBC
HCT: 33.6 % — ABNORMAL LOW (ref 36.0–46.0)
Hemoglobin: 11.9 g/dL — ABNORMAL LOW (ref 12.0–15.0)
MCH: 30.2 pg (ref 26.0–34.0)
MCHC: 35.4 g/dL (ref 30.0–36.0)
MCV: 85.3 fL (ref 80.0–100.0)
Platelets: 308 10*3/uL (ref 150–400)
RBC: 3.94 MIL/uL (ref 3.87–5.11)
RDW: 13.1 % (ref 11.5–15.5)
WBC: 16.3 10*3/uL — ABNORMAL HIGH (ref 4.0–10.5)
nRBC: 0 % (ref 0.0–0.2)

## 2021-05-29 LAB — POCT FERN TEST: POCT Fern Test: POSITIVE

## 2021-05-29 LAB — TYPE AND SCREEN
ABO/RH(D): A POS
Antibody Screen: NEGATIVE

## 2021-05-29 MED ORDER — ACETAMINOPHEN 325 MG PO TABS
650.0000 mg | ORAL_TABLET | ORAL | Status: DC | PRN
  Administered 2021-05-29: 650 mg via ORAL
  Filled 2021-05-29: qty 2

## 2021-05-29 MED ORDER — OXYCODONE-ACETAMINOPHEN 5-325 MG PO TABS: 1.0000 | ORAL_TABLET | ORAL | Status: DC | PRN

## 2021-05-29 MED ORDER — SOD CITRATE-CITRIC ACID 500-334 MG/5ML PO SOLN: 30.0000 mL | ORAL | Status: DC | PRN

## 2021-05-29 MED ORDER — MORPHINE SULFATE (PF) 4 MG/ML IV SOLN
2.0000 mg | Freq: Once | INTRAVENOUS | Status: AC
  Administered 2021-05-29: 2 mg via INTRAMUSCULAR
  Filled 2021-05-29: qty 1

## 2021-05-29 MED ORDER — LIDOCAINE HCL (PF) 1 % IJ SOLN
INTRAMUSCULAR | Status: DC | PRN
  Administered 2021-05-29: 10 mL via EPIDURAL

## 2021-05-29 MED ORDER — ACETAMINOPHEN 325 MG PO TABS: 650.0000 mg | ORAL_TABLET | ORAL | Status: DC | PRN

## 2021-05-29 MED ORDER — OXYTOCIN-SODIUM CHLORIDE 30-0.9 UT/500ML-% IV SOLN: 2.5000 [IU]/h | INTRAVENOUS | Status: DC

## 2021-05-29 MED ORDER — DIPHENHYDRAMINE HCL 25 MG PO CAPS
25.0000 mg | ORAL_CAPSULE | Freq: Four times a day (QID) | ORAL | Status: DC | PRN
Start: 2021-05-29 — End: 2021-05-31

## 2021-05-29 MED ORDER — LACTATED RINGERS IV SOLN
500.0000 mL | Freq: Once | INTRAVENOUS | Status: AC
  Administered 2021-05-29: 500 mL via INTRAVENOUS

## 2021-05-29 MED ORDER — SIMETHICONE 80 MG PO CHEW: 80.0000 mg | CHEWABLE_TABLET | ORAL | Status: DC | PRN

## 2021-05-29 MED ORDER — OXYCODONE-ACETAMINOPHEN 5-325 MG PO TABS: 2.0000 | ORAL_TABLET | ORAL | Status: DC | PRN

## 2021-05-29 MED ORDER — WITCH HAZEL-GLYCERIN EX PADS: 1.0000 "application " | MEDICATED_PAD | CUTANEOUS | Status: DC | PRN

## 2021-05-29 MED ORDER — DIPHENHYDRAMINE HCL 50 MG/ML IJ SOLN: 12.5000 mg | INTRAMUSCULAR | Status: DC | PRN

## 2021-05-29 MED ORDER — DIBUCAINE (PERIANAL) 1 % EX OINT
1.0000 | TOPICAL_OINTMENT | CUTANEOUS | Status: DC | PRN
Start: 2021-05-29 — End: 2021-05-31

## 2021-05-29 MED ORDER — OXYTOCIN BOLUS FROM INFUSION
333.0000 mL | Freq: Once | INTRAVENOUS | Status: AC
Start: 2021-05-29 — End: 2021-05-29
  Administered 2021-05-29: 333 mL via INTRAVENOUS

## 2021-05-29 MED ORDER — BENZOCAINE-MENTHOL 20-0.5 % EX AERO
1.0000 "application " | INHALATION_SPRAY | CUTANEOUS | Status: DC | PRN
  Administered 2021-05-29: 1 via TOPICAL
  Filled 2021-05-29: qty 56

## 2021-05-29 MED ORDER — ONDANSETRON HCL 4 MG PO TABS: 4.0000 mg | ORAL_TABLET | ORAL | Status: DC | PRN

## 2021-05-29 MED ORDER — IBUPROFEN 600 MG PO TABS
600.0000 mg | ORAL_TABLET | Freq: Four times a day (QID) | ORAL | Status: DC
  Administered 2021-05-29 – 2021-05-31 (×7): 600 mg via ORAL
  Filled 2021-05-29 (×7): qty 1

## 2021-05-29 MED ORDER — LACTATED RINGERS IV SOLN: 500.0000 mL | INTRAVENOUS | Status: DC | PRN

## 2021-05-29 MED ORDER — FENTANYL CITRATE (PF) 100 MCG/2ML IJ SOLN
100.0000 ug | Freq: Once | INTRAMUSCULAR | Status: AC
  Administered 2021-05-29: 100 ug via INTRAMUSCULAR
  Filled 2021-05-29: qty 2

## 2021-05-29 MED ORDER — LIDOCAINE HCL (PF) 1 % IJ SOLN: 30.0000 mL | INTRAMUSCULAR | Status: DC | PRN

## 2021-05-29 MED ORDER — LIDOCAINE HCL (PF) 1 % IJ SOLN
30.0000 mL | INTRAMUSCULAR | Status: DC | PRN
Start: 2021-05-29 — End: 2021-05-29

## 2021-05-29 MED ORDER — TETANUS-DIPHTH-ACELL PERTUSSIS 5-2.5-18.5 LF-MCG/0.5 IM SUSY: 0.5000 mL | PREFILLED_SYRINGE | Freq: Once | INTRAMUSCULAR | Status: DC

## 2021-05-29 MED ORDER — ONDANSETRON HCL 4 MG/2ML IJ SOLN: 4.0000 mg | Freq: Four times a day (QID) | INTRAMUSCULAR | Status: DC | PRN

## 2021-05-29 MED ORDER — MEASLES, MUMPS & RUBELLA VAC IJ SOLR: 0.5000 mL | Freq: Once | INTRAMUSCULAR | Status: DC

## 2021-05-29 MED ORDER — COCONUT OIL OIL
1.0000 "application " | TOPICAL_OIL | Status: DC | PRN
  Administered 2021-05-29: 1 via TOPICAL

## 2021-05-29 MED ORDER — LACTATED RINGERS IV SOLN: INTRAVENOUS | Status: DC

## 2021-05-29 MED ORDER — FENTANYL-BUPIVACAINE-NACL 0.5-0.125-0.9 MG/250ML-% EP SOLN
12.0000 mL/h | EPIDURAL | Status: DC | PRN
  Administered 2021-05-29: 12 mL/h via EPIDURAL
  Filled 2021-05-29: qty 250

## 2021-05-29 MED ORDER — ONDANSETRON HCL 4 MG/2ML IJ SOLN: 4.0000 mg | INTRAMUSCULAR | Status: DC | PRN

## 2021-05-29 MED ORDER — FLEET ENEMA 7-19 GM/118ML RE ENEM: 1.0000 | ENEMA | RECTAL | Status: DC | PRN

## 2021-05-29 MED ORDER — OXYTOCIN-SODIUM CHLORIDE 30-0.9 UT/500ML-% IV SOLN
2.5000 [IU]/h | INTRAVENOUS | Status: DC
  Filled 2021-05-29: qty 500

## 2021-05-29 MED ORDER — SENNOSIDES-DOCUSATE SODIUM 8.6-50 MG PO TABS
2.0000 | ORAL_TABLET | ORAL | Status: DC
  Administered 2021-05-29 – 2021-05-31 (×2): 2 via ORAL
  Filled 2021-05-29 (×2): qty 2

## 2021-05-29 MED ORDER — EPHEDRINE 5 MG/ML INJ: 10.0000 mg | INTRAVENOUS | Status: DC | PRN

## 2021-05-29 MED ORDER — PHENYLEPHRINE 40 MCG/ML (10ML) SYRINGE FOR IV PUSH (FOR BLOOD PRESSURE SUPPORT): 80.0000 ug | PREFILLED_SYRINGE | INTRAVENOUS | Status: DC | PRN

## 2021-05-29 MED ORDER — OXYTOCIN BOLUS FROM INFUSION: 333.0000 mL | Freq: Once | INTRAVENOUS | Status: DC

## 2021-05-29 MED ORDER — PROMETHAZINE HCL 25 MG/ML IJ SOLN
12.5000 mg | Freq: Once | INTRAMUSCULAR | Status: AC
  Administered 2021-05-29: 12.5 mg via INTRAMUSCULAR
  Filled 2021-05-29: qty 1

## 2021-05-29 MED ORDER — PRENATAL MULTIVITAMIN CH
1.0000 | ORAL_TABLET | Freq: Every day | ORAL | Status: DC
  Administered 2021-05-30: 1 via ORAL
  Filled 2021-05-29 (×2): qty 1

## 2021-05-29 NOTE — MAU Note (Signed)
...  Maria Ware is a 25 y.o. at [redacted]w[redacted]d here in MAU reporting: CTX through the night. Sent home from MAU after receiving Fentanyl around 0245. CTX currently a few minutes apart. +FM. No VB or LOF. GBS-.   Pain score: 10/10 lower abdominal  Vitals:   05/29/21 0926  BP: (!) 133/91  Pulse: 84  Resp: 17  Temp: 98.2 F (36.8 C)  SpO2: 98%

## 2021-05-29 NOTE — Progress Notes (Signed)
Written and verbal d/c instructions given and understanding voiced. 

## 2021-05-29 NOTE — Progress Notes (Signed)
Refusing foley catheter at this time. Pt. Educated about need to empty bladder and risks. Pt. States she will wait until her contractions get better. Pt. Given option of intermittent straight cath vs indwelling. PCEA and position change to assist with pain control.

## 2021-05-29 NOTE — Plan of Care (Signed)

## 2021-05-29 NOTE — MAU Note (Cosign Needed Addendum)
S: Ms. Maria Ware is a 25 y.o. G1P0 at [redacted]w[redacted]d  who presents to MAU today for labor evaluation.     Cervical exam by RN:  Dilation: 1.5 Effacement (%): 80 Cervical Position: Anterior Station: 0 Presentation: Vertex Exam by:: Quintella Baton rNC  Fetal Monitoring: Baseline: 130 BPM Variability: mod Accelerations: pos Decelerations: neg Contractions: q1-24min  MDM Discussed patient with RN. NST reviewed.   A: SIUP at [redacted]w[redacted]d  False labor  P: Discharge home Labor precautions and kick counts included in AVS Patient to follow-up with [facility] as scheduled  Patient may return to MAU as needed or when in labor   Alfredo Martinez, MD 05/29/2021 2:51 AM  GME ATTESTATION:  I saw and evaluated the patient. I agree with the findings and the plan of care as documented in the resident's note.  Alric Seton, MD OB Fellow, Faculty [facility], Center for [facility] 05/29/2021 6:04 AM

## 2021-05-29 NOTE — Progress Notes (Signed)
Dr Germaine Pomfret notified of pt's admission and sve x 2. Aware pt would like pain med. EFM strip reviewed by MD. Pt may go home or stay couple more hours whichever she prefers. IM pain med ordered.

## 2021-05-29 NOTE — Anesthesia Preprocedure Evaluation (Signed)
Anesthesia Evaluation  Patient identified by MRN, date of birth, ID band Patient awake    Reviewed: Allergy & Precautions, H&P , NPO status , Patient's Chart, lab work & pertinent test results  History of Anesthesia Complications Negative for: history of anesthetic complications  Airway Mallampati: II  TM Distance: >3 FB     Dental   Pulmonary neg pulmonary ROS, former smoker,    Pulmonary exam normal        Cardiovascular negative cardio ROS   Rhythm:regular Rate:Normal     Neuro/Psych negative neurological ROS  negative psych ROS   GI/Hepatic negative GI ROS, Neg liver ROS,   Endo/Other  negative endocrine ROS  Renal/GU negative Renal ROS  negative genitourinary   Musculoskeletal   Abdominal   Peds  Hematology negative hematology ROS (+)   Anesthesia Other Findings   Reproductive/Obstetrics (+) Pregnancy                             Anesthesia Physical Anesthesia Plan  ASA: 2  Anesthesia Plan: Epidural   Post-op Pain Management:    Induction:   PONV Risk Score and Plan:   Airway Management Planned:   Additional Equipment:   Intra-op Plan:   Post-operative Plan:   Informed Consent: I have reviewed the patients History and Physical, chart, labs and discussed the procedure including the risks, benefits and alternatives for the proposed anesthesia with the patient or authorized representative who has indicated his/her understanding and acceptance.       Plan Discussed with:   Anesthesia Plan Comments:         Anesthesia Quick Evaluation

## 2021-05-29 NOTE — MAU Provider Note (Signed)
  S: Ms. Maria Ware is a 25 y.o. G1P0 at [redacted]w[redacted]d  who presents to MAU today complaining contractions since last night every few minutes since. She denies vaginal bleeding. She denies LOF. She reports normal fetal movement.    O: BP 121/76   Pulse 72   Temp 98.2 F (36.8 C) (Oral)   Resp 17   LMP 08/27/2020 (Exact Date)   SpO2 99%  GENERAL: Well-developed, well-nourished female in no acute distress.  HEAD: Normocephalic, atraumatic.  CHEST: Normal effort of breathing, regular heart rate ABDOMEN: Soft, nontender, gravid  Cervical exam:  Dilation: 2.5 Effacement (%): 90 Station: -1 Presentation: Vertex Exam by:: [employer], RN  Fetal Monitoring: Baseline: 140 Variability: mod Accelerations: + Decelerations: no Contractions: irregular  MDM: Medicated x2 for pain, minimal cervical change, SROM @1220 . Plan for admit  A: SIUP at [redacted]w[redacted]d  SROM Early labor  P: Admit to LD Expectant mngt  [redacted]w[redacted]d, CNM 05/29/2021 11:02 AM

## 2021-05-29 NOTE — Lactation Note (Signed)
This note was copied from a baby's chart. Lactation Consultation Note  Patient Name: Maria Ware Today's Date: 05/29/2021 Reason for consult: L&D Initial assessment;Primapara;1st time breastfeeding;Term;Other (Comment) Age: at 10 mins of age  Baby awake and rooting, after several attempts and assistance with latch   Baby latched 3 different times for 7 mins, 8 mins , and 3 mins with Latch score of 7's.  Worked on depth. Swallows noted.  Maternal Data    Feeding Mother's Current Feeding Choice: Breast Milk  LATCH Score Latch: Repeated attempts needed to sustain latch, nipple held in mouth throughout feeding, stimulation needed to elicit sucking reflex.  Audible Swallowing: A few with stimulation  Type of Nipple: Everted at rest and after stimulation  Comfort (Breast/Nipple): Soft / non-tender  Hold (Positioning): Assistance needed to correctly position infant at breast and maintain latch.  LATCH Score: 7   Lactation Tools Discussed/Used    Interventions Interventions: Breast feeding basics reviewed;Education;Assisted with latch;Skin to skin;Breast massage;Hand express;Breast compression;Adjust position;Support pillows  Discharge    Consult Status Consult Status: Follow-up (after L/D) Date: 05/29/21 Follow-up type: In-patient    Matilde Sprang Naidelyn Parrella 05/29/2021, 7:28 PM

## 2021-05-29 NOTE — Progress Notes (Signed)
Offered pt Fentanyl and go home or stay for further eval. Pt states she will take pain med and then go home. Dr Germaine Pomfret notified and will place d/c order

## 2021-05-29 NOTE — Discharge Summary (Signed)
Postpartum Discharge Summary    Patient Name: Maria Ware DOB: 18-Feb-1996 MRN: [id number]  Date of admission: 05/29/2021 Delivery date:05/29/2021  Delivering provider: Julianne Handler  Date of discharge: 05/31/2021  Admitting diagnosis: Indication for care in labor and delivery, antepartum [O75.9] Normal labor [O80, Z37.9] Intrauterine pregnancy: [redacted]w[redacted]d    Secondary diagnosis:  Active Problems:   Indication for care in labor and delivery, antepartum   Normal labor   SVD (spontaneous vaginal delivery)  Additional problems: late prenatal care, Covid infection in first trimester    Discharge diagnosis: Term Pregnancy Delivered                                              Post partum procedures: IV antibiotics for UTI Augmentation: N/A Complications: None  Hospital course: Onset of Labor With Vaginal Delivery      25y.o. yo G1P0 at 26w2das admitted in Latent Labor on 05/29/2021. Patient had an uncomplicated labor course as follows:  Membrane Rupture Time/Date: 12:20 PM ,05/29/2021   Delivery Method:Vaginal, Spontaneous  Episiotomy: None  Lacerations:  Labial;Vaginal;1st degree  Patient had an uncomplicated postpartum course.  She is ambulating, tolerating a regular diet, passing flatus, and urinating well. Patient is discharged home in stable condition on 05/31/21.  Newborn Data: Birth date:05/29/2021  Birth time:6:08 PM  Gender:Female  Living status:Living  Apgars:9 ,9  Weight:3391 g   Magnesium Sulfate received: No BMZ received: No Rhophylac:N/A MMR:N/A T-DaP: no Flu: No Transfusion:No  Physical exam  Vitals:   05/30/21 2046 05/31/21 0013 05/31/21 0120 05/31/21 0545  BP: 91/76   (!) 101/55  Pulse: 98   72  Resp: 19   20  Temp:  100.3 F (37.9 C) 98.3 F (36.8 C) 97.8 F (36.6 C)  TempSrc: Oral Oral Oral Oral  SpO2: 98%      General: alert, cooperative, and no distress Lochia: appropriate Uterine Fundus: firm Incision: N/A DVT Evaluation: No  evidence of DVT seen on physical exam. Labs: Lab Results  Component Value Date   WBC 18.3 (H) 05/30/2021   HGB 12.3 05/30/2021   HCT 33.5 (L) 05/30/2021   MCV 85.0 05/30/2021   PLT 278 05/30/2021   CMP Latest Ref Rng & Units 02/28/2021  Glucose 65 - 99 mg/dL 74  BUN 6 - 20 mg/dL 5(L)  Creatinine 0.57 - 1.00 mg/dL 0.58  Sodium 134 - 144 mmol/L 139  Potassium 3.5 - 5.2 mmol/L 4.1  Chloride 96 - 106 mmol/L 102  CO2 20 - 29 mmol/L 18(L)  Calcium 8.7 - 10.2 mg/dL 9.1  Total Protein 6.0 - 8.5 g/dL 6.5  Total Bilirubin 0.0 - 1.2 mg/dL <0.2  Alkaline Phos 44 - 121 IU/L 105  AST 0 - 40 IU/L 17  ALT 0 - 32 IU/L 11   Edinburgh Score: Edinburgh Postnatal Depression Scale Screening Tool 05/30/2021  I have been able to laugh and see the funny side of things. 0  I have looked forward with enjoyment to things. 0  I have blamed myself unnecessarily when things went wrong. 0  I have been anxious or worried for no good reason. 0  I have felt scared or panicky for no good reason. 0  Things have been getting on top of me. 0  I have been so unhappy that I have had difficulty sleeping. 0  I have felt sad  or miserable. 0  I have been so unhappy that I have been crying. 0  The thought of harming myself has occurred to me. 0  Edinburgh Postnatal Depression Scale Total 0     After visit meds:  Allergies as of 05/31/2021   No Known Allergies      Medication List     TAKE these medications    ibuprofen 600 MG tablet Commonly known as: ADVIL Take 1 tablet (600 mg total) by mouth every 6 (six) hours.   PRENATAL VITAMINS PO Take 1 tablet by mouth daily.         Discharge home in stable condition Infant Feeding: Breast Infant Disposition:home with mother Discharge instruction: per After Visit Summary and Postpartum booklet. Activity: Advance as tolerated. Pelvic rest for 6 weeks.  Diet: routine diet Future Appointments: Future Appointments  Date Time Provider [place]   07/09/2021  8:35 AM Chancy Milroy, MD [facility]   Follow up Visit:  [place] [facility] [place] [facility] Follow up.   Specialty: Obstetrics and Gynecology Why: In 4 weeks for a postpartum appt Contact information: [place] [id number] [phone]                 Please schedule this patient for a In person postpartum visit in 6 weeks with the following provider: Any provider. Additional Postpartum F/U: none   Low risk pregnancy complicated by:  none Delivery mode:  Vaginal, Spontaneous  Anticipated Birth Control:  Condoms   05/31/2021 Wende Mott, CNM

## 2021-05-29 NOTE — H&P (Addendum)
OBSTETRIC ADMISSION HISTORY AND PHYSICAL  Maria Ware is a 25 y.o. female G1P0 with IUP at [redacted]w[redacted]d by LMP presenting for early labor and SROM. She reports +FMs, no VB, no blurry vision, headaches or peripheral edema, and RUQ pain. She had SROM around 12 PM today. Her cramping and pain has improved drastically since receiving her epidural. She plans on breast feeding. She requests condoms and natural family planning for birth control. She received her prenatal care at [facility].   Dating: By LMP --->  Estimated Date of Delivery: 06/03/21  [place]: [place] [facility] OB Follow Up 01/31/21  @[redacted]w[redacted]d , CWD, normal anatomy, cephalic presentation, 524 g, EFW   Prenatal History/Complications: - Late prenatal care, initial OB appt at 18 weeks  - COVID-19 infection in first trimester   Past Medical History: Past Medical History:  Diagnosis Date   COVID-19 affecting pregnancy in first trimester 11/03/2020   History of acute PID 12/23/2020    Past Surgical History: Past Surgical History:  Procedure Laterality Date   NO PAST SURGERIES      Obstetrical History: OB History     Gravida  1   Para      Term      Preterm      AB      Living         SAB      IAB      Ectopic      Multiple      Live Births              Social History: Social History   Socioeconomic History   Marital status: Single    Spouse name: Not on file   Number of children: Not on file   Years of education: Not on file   Highest education level: Not on file  Occupational History   Not on file  Tobacco Use   Smoking status: Former    Types: Cigars    Quit date: 12/24/2019    Years since quitting: 1.4   Smokeless tobacco: Never   Tobacco comments:    Black and Mild  Vaping Use   Vaping Use: Never used  Substance and Sexual Activity   Alcohol use: Not Currently    Comment: rarely   Drug use: No   Sexual activity: Yes    Birth control/protection: None  Other Topics Concern   Not on file  Social  History Narrative   Not on file   Social Determinants of Health   Financial Resource Strain: Not on file  Food Insecurity: No Food Insecurity   Worried About Running Out of Food in the Last Year: Never true   Ran Out of Food in the Last Year: Never true  Transportation Needs: No Transportation Needs   Lack of Transportation (Medical): No   Lack of Transportation (Non-Medical): No  Physical Activity: Not on file  Stress: Not on file  Social Connections: Not on file    Family History: Family History  Problem Relation Age of Onset   Cancer Maternal Grandmother    Other Brother     Allergies: No Known Allergies  Medications Prior to Admission  Medication Sig Dispense Refill Last Dose   Prenatal Vit-Fe Fumarate-FA (PRENATAL VITAMINS PO) Take 1 tablet by mouth daily.        Review of Systems   All systems reviewed and negative except as stated in HPI  Blood pressure 121/76, pulse 72, temperature 98.2 F (36.8 C), temperature source Oral, resp. rate  17, last menstrual period 08/27/2020, SpO2 99 %, unknown if currently breastfeeding. General appearance: alert and no distress Lungs: normal effort Heart: regular rate  Abdomen: soft, non-tender; bowel sounds normal Pelvic: gravid uterus Extremities: Homans sign is negative, no sign of DVT Presentation: cephalic Fetal monitoringBaseline: 145 bpm, Variability: Good {> 6 bpm), and Decelerations: Variable: occasional Uterine activity: Frequency: Every 4-7 minutes Dilation: 3 Effacement (%): 90 Station: -1 Exam by:: Imagene Sheller, RN  Prenatal labs: ABO, Rh: A/Positive/-- (02/24 0940) Antibody: Negative (02/24 0940) Rubella: 5.54 (02/24 0940) RPR: Non Reactive (04/22 0807)  HBsAg: Negative (02/24 0940)  HIV: Non Reactive (04/22 0807)  GBS: Negative/-- (07/07 1412)  2 hr Glucola: Patient declined  Genetic screening: Patient declined  Anatomy US: normal   Prenatal Transfer Tool  Maternal Diabetes: No; patient declined  2 hr GTT, A1c 4.6 (01/02/21) and 4.7 (02/28/21)  Genetic Screening: Declined Maternal Ultrasounds/Referrals: Normal Fetal Ultrasounds or other Referrals:  None Maternal Substance Abuse:  No Significant Maternal Medications:  None Significant Maternal Lab Results: Group B Strep negative  Results for orders placed or performed during the hospital encounter of 05/29/21 (from the past 24 hour(s))  Fern Test   Collection Time: 05/29/21 12:27 PM  Result Value Ref Range   POCT Fern Test Positive = ruptured amniotic membanes     Patient Active Problem List   Diagnosis Date Noted   COVID-19 vaccination declined 01/31/2021   Late prenatal care affecting pregnancy in second trimester 01/02/2021   Supervision of low-risk pregnancy 12/23/2020   COVID-19 affecting pregnancy in first trimester 11/03/2020    Assessment/Plan:  Maria Ware is a 25 y.o. G1P0 at [redacted]w[redacted]d here for early labor and SROM.   #Labor: early  #Pain: epidural  #FWB: Category 2 #ID:  GBS negative  #MOF: breast #MOC: condoms #Circ:  Yes  Admit to L&D.  Expectant management  Anticipate SVD  Athena Masse, PA student  05/29/2021, 12:56 PM  Midwife attestation: I have seen and examined this patient; I agree with above documentation in the student's note.   PE: Gen: calm comfortable, NAD Resp: normal effort and rate Abd: gravid  ROS, labs, PMH reviewed  Assessment/Plan: [redacted] weeks gestation Labor: early FWB: Cat II GBS: neg Admit to LD Expectant mngt Anticipate SVD  Donette Larry, CNM  05/29/2021, 4:19 PM

## 2021-05-29 NOTE — Anesthesia Procedure Notes (Signed)
Epidural Patient location during procedure: OB Start time: 05/29/2021 2:00 PM End time: 05/29/2021 2:11 PM  Staffing Anesthesiologist: Lucretia Kern, MD Performed: anesthesiologist   Preanesthetic Checklist Completed: patient identified, IV checked, risks and benefits discussed, monitors and equipment checked, pre-op evaluation and timeout performed  Epidural Patient position: sitting Prep: DuraPrep Patient monitoring: heart rate, continuous pulse ox and blood pressure Approach: midline Location: L3-L4 Injection technique: LOR air  Needle:  Needle type: Tuohy  Needle gauge: 17 G Needle length: 9 cm Needle insertion depth: 5 cm Catheter type: closed end flexible Catheter size: 19 Gauge Catheter at skin depth: 10 cm Test dose: negative  Assessment Events: blood not aspirated, injection not painful, no injection resistance, no paresthesia and negative IV test  Additional Notes Reason for block:procedure for pain

## 2021-05-30 LAB — URINALYSIS, ROUTINE W REFLEX MICROSCOPIC
Bilirubin Urine: NEGATIVE
Glucose, UA: NEGATIVE mg/dL
Ketones, ur: NEGATIVE mg/dL
Nitrite: POSITIVE — AB
Protein, ur: NEGATIVE mg/dL
Specific Gravity, Urine: 1.006 (ref 1.005–1.030)
pH: 6 (ref 5.0–8.0)

## 2021-05-30 LAB — CBC
HCT: 33.5 % — ABNORMAL LOW (ref 36.0–46.0)
Hemoglobin: 12.3 g/dL (ref 12.0–15.0)
MCH: 31.2 pg (ref 26.0–34.0)
MCHC: 36.7 g/dL — ABNORMAL HIGH (ref 30.0–36.0)
MCV: 85 fL (ref 80.0–100.0)
Platelets: 278 10*3/uL (ref 150–400)
RBC: 3.94 MIL/uL (ref 3.87–5.11)
RDW: 13.1 % (ref 11.5–15.5)
WBC: 18.3 10*3/uL — ABNORMAL HIGH (ref 4.0–10.5)
nRBC: 0 % (ref 0.0–0.2)

## 2021-05-30 LAB — RPR: RPR Ser Ql: NONREACTIVE

## 2021-05-30 MED ORDER — SODIUM CHLORIDE 0.9 % IV SOLN
2.0000 g | INTRAVENOUS | Status: DC
  Administered 2021-05-30: 2 g via INTRAVENOUS
  Filled 2021-05-30: qty 20
  Filled 2021-05-30: qty 2

## 2021-05-30 MED ORDER — ACETAMINOPHEN 500 MG PO TABS
1000.0000 mg | ORAL_TABLET | Freq: Four times a day (QID) | ORAL | Status: DC | PRN
  Administered 2021-05-30 – 2021-05-31 (×3): 1000 mg via ORAL
  Filled 2021-05-30 (×2): qty 2

## 2021-05-30 NOTE — Anesthesia Postprocedure Evaluation (Signed)
Anesthesia Post Note  Patient: Maria Ware  Procedure(s) Performed: AN AD HOC LABOR EPIDURAL     Patient location during evaluation: Mother Baby Anesthesia Type: Epidural Level of consciousness: awake and alert Pain management: pain level controlled Vital Signs Assessment: post-procedure vital signs reviewed and stable Respiratory status: spontaneous breathing, nonlabored ventilation and respiratory function stable Cardiovascular status: stable Postop Assessment: no headache, no backache, epidural receding, no apparent nausea or vomiting, patient able to bend at knees, adequate PO intake and able to ambulate Anesthetic complications: no   No notable events documented.  Last Vitals:  Vitals:   05/30/21 0200 05/30/21 0600  BP: 106/68 96/66  Pulse: 76 75  Resp: 16 17  Temp: 36.9 C 36.8 C  SpO2: 99% 100%    Last Pain:  Vitals:   05/30/21 0600  TempSrc: Oral  PainSc: 0-No pain   Pain Goal:                   [name]

## 2021-05-30 NOTE — Progress Notes (Signed)
Post Partum Day 1 Subjective: no complaints, up ad lib, voiding, tolerating PO, and + flatus  Objective: Blood pressure 96/66, pulse 75, temperature 98.3 F (36.8 C), temperature source Oral, resp. rate 17, last menstrual period 08/27/2020, SpO2 100 %, unknown if currently breastfeeding.  Physical Exam:  General: alert, cooperative, and no distress Lochia: appropriate Uterine Fundus: firm Incision: n/a DVT Evaluation: No evidence of DVT seen on physical exam.  Recent Labs    05/29/21 1245  HGB 11.9*  HCT 33.6*    Assessment/Plan: Plan for discharge tomorrow, Breastfeeding, and Lactation consult   LOS: 1 day   Rolm Bookbinder CNM 05/30/2021, 9:26 AM

## 2021-05-30 NOTE — Progress Notes (Signed)
Notified by [employer] of new onset oral fever. Discussed with Dr. Despina Hidden. Patient currently asymptomatic. Will give 1G Tylenol, collect CBC, UA. Please recheck temp one hour s/p Tylenol. Orders placed and RN notified of new orders.   Clayton Bibles, MSN, CNM Certified Nurse Midwife, [employer] for [employer], [facility] 05/30/21 5:30 PM

## 2021-05-30 NOTE — Progress Notes (Signed)
CNM notified of persistent fever and results of blood work and urinalysis. Consulted with Dr. Despina Hidden and will treat for upper urinary tract infection with Rocephin IV. RN made aware.   Rolm Bookbinder, CNM 05/30/21 6:52 PM

## 2021-05-30 NOTE — Lactation Note (Signed)
This note was copied from a baby's chart. Lactation Consultation Note  Patient Name: Maria Ware Today's Date: 05/30/2021 Reason for consult: Initial assessment;Primapara;1st time breastfeeding;Term Age:26 hours   LC Note:  Attempted to visit with mother, however, she just received her breakfast.  Suggested she call for my return visit at her convenience after breakfast.  Parents appreciative.   Maternal Data    Feeding Mother's Current Feeding Choice: Breast Milk  LATCH Score                    Lactation Tools Discussed/Used    Interventions    Discharge    Consult Status Consult Status: Follow-up Date: 05/30/21 Follow-up type: In-patient    Morris Longenecker R Sephiroth Mcluckie 05/30/2021, 8:51 AM

## 2021-05-31 MED ORDER — IBUPROFEN 600 MG PO TABS: 600.0000 mg | ORAL_TABLET | Freq: Four times a day (QID) | ORAL | 0 refills | Status: DC

## 2021-05-31 NOTE — Lactation Note (Signed)
This note was copied from a baby's chart. Lactation Consultation Note  Patient Name: Maria Ware Today's Date: 05/31/2021 Reason for consult: Follow-up assessment;1st time breastfeeding;Primapara;Term Age:26 hours  P1 mother whose infant is now 81 hours old.  This is a term baby at 39+2 weeks.  Mother is breast feeding and supplementing with donor breast milk.  She has been primarily giving donor breast milk.  Mother stated that she is still trying to work on nipple eversion.  She has breast shells but is not wearing them currently.  She has also been given a NS but has not used this consistently.  Mother has not been pumping.  Reviewed breast feeding basics and milk "coming to volume."  Emphasized the importance of breast stimulation regularly to help ensure a good milk supply.  Suggested hand expression and pumping after every feeding.  Mother is concerned about finding formula after discharge since she will not be able to use the donor milk.  Suggested she talk with her pediatrician regarding formula supply.  Mother has a DEBP for home use and expressed a desire to pump at home.  Asked her to pump every three hours for 15 minutes.  Mother verbalized understanding.  Engorgement prevention/treatment discussed.  Mother has our OP phone number for questions/concerns after discharge.  Father present and supportive.   Maternal Data    Feeding Mother's Current Feeding Choice: Breast Milk and Donor Milk Nipple Type: Slow - flow  LATCH Score                    Lactation Tools Discussed/Used    Interventions    Discharge Discharge Education: Engorgement and breast care Pump: Manual;Personal  Consult Status Consult Status: Complete Date: 05/31/21 Follow-up type: Call as needed    Alizeh Madril R Tanetta Fuhriman 05/31/2021, 7:15 AM

## 2021-05-31 NOTE — Progress Notes (Signed)
Attending Circumcision Counseling Progress Note  Patient desires circumcision for her female infant.  Circumcision procedure details discussed, risks and benefits of procedure were also discussed.  These include but are not limited to: Benefits of circumcision in men include reduction in the rates of urinary tract infection (UTI), penile cancer, some sexually transmitted infections, penile inflammatory and retractile disorders, as well as easier hygiene.  Risks include bleeding , infection, injury of glans which may lead to penile deformity or urinary tract issues, unsatisfactory cosmetic appearance and other potential complications related to the procedure.  It was emphasized that this is an elective procedure.  Patient wants to proceed with circumcision; written informed consent obtained.  Will do circumcision soon, routine circumcision and post circumcision care ordered for the infant.  Ozan Maclay L. Alysia Penna, M.D. 05/31/2021 11:33 AM

## 2021-06-02 ENCOUNTER — Encounter: Payer: Medicaid Other | Admitting: Student

## 2021-06-02 LAB — CULTURE, OB URINE: Culture: >=100000 — AB

## 2021-06-03 ENCOUNTER — Inpatient Hospital Stay (HOSPITAL_COMMUNITY): Admit: 2021-06-03 | Payer: Self-pay

## 2021-06-10 ENCOUNTER — Telehealth (HOSPITAL_COMMUNITY): Payer: Self-pay | Admitting: *Deleted

## 2021-06-10 NOTE — Telephone Encounter (Signed)
Attempted Hospital Discharge Follow-Up Call.  Left voice mail requesting that patient return RN's phone call.  

## 2021-06-16 ENCOUNTER — Ambulatory Visit (INDEPENDENT_AMBULATORY_CARE_PROVIDER_SITE_OTHER): Payer: Medicaid Other | Admitting: *Deleted

## 2021-06-16 ENCOUNTER — Other Ambulatory Visit: Payer: Self-pay

## 2021-06-16 VITALS — BP 116/70 | HR 92 | Temp 99.3°F | Ht 65.0 in | Wt 137.8 lb

## 2021-06-16 DIAGNOSIS — R300 Dysuria: Secondary | ICD-10-CM | POA: Diagnosis not present

## 2021-06-16 DIAGNOSIS — N390 Urinary tract infection, site not specified: Secondary | ICD-10-CM | POA: Diagnosis not present

## 2021-06-16 DIAGNOSIS — R1030 Lower abdominal pain, unspecified: Secondary | ICD-10-CM | POA: Diagnosis not present

## 2021-06-16 LAB — POCT URINALYSIS DIP (DEVICE)
Bilirubin Urine: NEGATIVE
Glucose, UA: NEGATIVE mg/dL
Ketones, ur: NEGATIVE mg/dL
Leukocytes,Ua: NEGATIVE
Nitrite: POSITIVE — AB
Protein, ur: NEGATIVE mg/dL
Specific Gravity, Urine: 1.02 (ref 1.005–1.030)
Urobilinogen, UA: 0.2 mg/dL (ref 0.0–1.0)
pH: 6.5 (ref 5.0–8.0)

## 2021-06-16 MED ORDER — NITROFURANTOIN MONOHYD MACRO 100 MG PO CAPS: 100.0000 mg | ORAL_CAPSULE | Freq: Two times a day (BID) | ORAL | 0 refills | Status: DC

## 2021-06-16 NOTE — Progress Notes (Signed)
Here for nurse visit for possible UTI. States had baby vaginally 05/29/21 and had chills and they found she had UTI  and gave her IV antibiotics while in hosptal. Discharged from hospital 05/31/21. Is breastfeeding , pumping mostly since baby not latching well. Denies breast issues. Since discharge c/o lower abdominal pain everytime she urinates. C/o chills at times. Clean catch Urinalysis obtained.  She has postpartum visit scheduled 07/11/21 . Temperature today = 99.3.  UA shows UTI with + nitrites and trace hgb. Culture ordered . RX for macrobid. Sent to pharmacy. Advised patient to call us if symptoms worsen.Advised if culture shows other antibiotic is better, will receive a call or message to change to new antibiotic.  Reviewed plan of care with Luna Kitchens, CNM- no chagnes to plan of care. Advised to keep postpartum appointment. Advised may make LC appointment if needed after she report some issues with baby latching, she is pumping often. Denies breast issues. She voices understanding with plan of care. Advised to contact us if develops fever over 100.5 or symptoms worsen.  Alazae Crymes,RN

## 2021-06-20 LAB — URINE CULTURE

## 2021-06-25 ENCOUNTER — Encounter: Payer: Self-pay | Admitting: Student

## 2021-06-25 DIAGNOSIS — N390 Urinary tract infection, site not specified: Secondary | ICD-10-CM | POA: Insufficient documentation

## 2021-06-25 DIAGNOSIS — O2341 Unspecified infection of urinary tract in pregnancy, first trimester: Secondary | ICD-10-CM | POA: Insufficient documentation

## 2021-07-09 ENCOUNTER — Ambulatory Visit: Payer: Self-pay | Admitting: Obstetrics and Gynecology

## 2021-07-11 ENCOUNTER — Ambulatory Visit: Payer: Medicaid Other | Admitting: Obstetrics and Gynecology

## 2021-07-30 ENCOUNTER — Ambulatory Visit: Payer: Medicaid Other | Admitting: Family Medicine

## 2021-08-14 NOTE — Progress Notes (Signed)
Chart reviewed for nurse visit. Agree with plan of care.   Tariyah Pendry Lorraine, CNM 08/14/2021 5:26 PM   

## 2021-11-26 ENCOUNTER — Other Ambulatory Visit: Payer: Self-pay

## 2021-11-26 ENCOUNTER — Ambulatory Visit (INDEPENDENT_AMBULATORY_CARE_PROVIDER_SITE_OTHER): Payer: Medicaid Other

## 2021-11-26 DIAGNOSIS — R8290 Unspecified abnormal findings in urine: Secondary | ICD-10-CM

## 2021-11-26 DIAGNOSIS — Z3200 Encounter for pregnancy test, result unknown: Secondary | ICD-10-CM

## 2021-11-26 DIAGNOSIS — R339 Retention of urine, unspecified: Secondary | ICD-10-CM

## 2021-11-26 LAB — POCT URINALYSIS DIP (DEVICE)
Bilirubin Urine: NEGATIVE
Glucose, UA: NEGATIVE mg/dL
Hgb urine dipstick: NEGATIVE
Ketones, ur: NEGATIVE mg/dL
Nitrite: POSITIVE — AB
Protein, ur: NEGATIVE mg/dL
Specific Gravity, Urine: 1.025 (ref 1.005–1.030)
Urobilinogen, UA: 0.2 mg/dL (ref 0.0–1.0)
pH: 6 (ref 5.0–8.0)

## 2021-11-26 LAB — POCT PREGNANCY, URINE: Preg Test, Ur: POSITIVE — AB

## 2021-11-26 NOTE — Progress Notes (Signed)
Here today for possible UTI & pregnancy. UPT in office today is positive. Reviewed dating with patient:   LMP: 09/25/21 EDD: 07/02/22 9w today  Patient states this is not a desired pregnancy. Planning for TAB, pt asks if we provide medical abortion. Explained we do not. Reviewed area resources for medical abortion. Reviewed miscarriage/ectopic precautions and availability of MAU for emergencies.  UA is suspicious for UTI. Patient reports lower abdominal cramps and incomplete emptying of bladder. Offered urine culture; pt agreeable.   Annabell Howells, RN 11/27/2021  4:37 PM

## 2021-11-28 LAB — URINE CULTURE, OB REFLEX

## 2021-11-28 LAB — CULTURE, OB URINE

## 2021-11-29 ENCOUNTER — Other Ambulatory Visit: Payer: Self-pay | Admitting: Family Medicine

## 2021-11-29 MED ORDER — NITROFURANTOIN MONOHYD MACRO 100 MG PO CAPS: 100.0000 mg | ORAL_CAPSULE | Freq: Two times a day (BID) | ORAL | 0 refills | Status: DC

## 2022-01-19 ENCOUNTER — Emergency Department (HOSPITAL_COMMUNITY)
Admission: EM | Admit: 2022-01-19 | Discharge: 2022-01-19 | Disposition: A | Discharge status: Home / Self Care | Payer: Medicaid Other | Attending: Emergency Medicine | Admitting: Emergency Medicine

## 2022-01-19 ENCOUNTER — Encounter (HOSPITAL_COMMUNITY): Payer: Self-pay

## 2022-01-19 DIAGNOSIS — K029 Dental caries, unspecified: Secondary | ICD-10-CM | POA: Diagnosis not present

## 2022-01-19 DIAGNOSIS — K0889 Other specified disorders of teeth and supporting structures: Secondary | ICD-10-CM

## 2022-01-19 LAB — GROUP A STREP BY PCR: Group A Strep by PCR: NOT DETECTED

## 2022-01-19 MED ORDER — NAPROXEN 500 MG PO TABS: 500.0000 mg | ORAL_TABLET | Freq: Two times a day (BID) | ORAL | 0 refills | Status: DC | PRN

## 2022-01-19 MED ORDER — KETOROLAC TROMETHAMINE 15 MG/ML IJ SOLN
30.0000 mg | Freq: Once | INTRAMUSCULAR | Status: AC
  Administered 2022-01-19: 30 mg via INTRAMUSCULAR
  Filled 2022-01-19: qty 2

## 2022-01-19 MED ORDER — AMOXICILLIN-POT CLAVULANATE 875-125 MG PO TABS: 1.0000 | ORAL_TABLET | Freq: Two times a day (BID) | ORAL | 0 refills | Status: DC

## 2022-01-19 MED ORDER — ONDANSETRON 4 MG PO TBDP
4.0000 mg | ORAL_TABLET | Freq: Once | ORAL | Status: AC
  Administered 2022-01-19: 4 mg via ORAL
  Filled 2022-01-19: qty 1

## 2022-01-19 MED ORDER — ACETAMINOPHEN 325 MG PO TABS
650.0000 mg | ORAL_TABLET | Freq: Once | ORAL | Status: AC | PRN
Start: 2022-01-19 — End: 2022-01-19
  Administered 2022-01-19: 650 mg via ORAL
  Filled 2022-01-19: qty 2

## 2022-01-19 MED ORDER — LIDOCAINE VISCOUS HCL 2 % MT SOLN: 15.0000 mL | Freq: Four times a day (QID) | OROMUCOSAL | 0 refills | Status: DC | PRN

## 2022-01-19 MED ORDER — LIDOCAINE VISCOUS HCL 2 % MT SOLN
15.0000 mL | Freq: Once | OROMUCOSAL | Status: AC
  Administered 2022-01-19: 15 mL via OROMUCOSAL
  Filled 2022-01-19: qty 15

## 2022-01-19 MED ORDER — AMOXICILLIN-POT CLAVULANATE 875-125 MG PO TABS
1.0000 | ORAL_TABLET | Freq: Once | ORAL | Status: AC
  Administered 2022-01-19: 1 via ORAL
  Filled 2022-01-19: qty 1

## 2022-01-19 MED ORDER — ONDANSETRON 4 MG PO TBDP: 4.0000 mg | ORAL_TABLET | Freq: Three times a day (TID) | ORAL | 0 refills | Status: DC | PRN

## 2022-01-19 NOTE — ED Triage Notes (Signed)
Pt c/o sore throat, HA, L molar dental pain for two days. Pt denies cough, fever, N/V/D.  ?

## 2022-01-19 NOTE — Discharge Instructions (Addendum)
Call one of the dentists offices provided to schedule an appointment for re-evaluation and further management within the next 48 hours.  ? ?I have prescribed you Augmentin which is an antibiotic to treat the infection and Naproxen which is an anti-inflammatory medicine to treat the pain.  ? ?Please take all of your antibiotics until finished. You may develop abdominal discomfort or diarrhea from the antibiotic.  You may help offset this with probiotics which you can buy at the store (ask your pharmacist if unable to find) or get probiotics in the form of eating yogurt. Do not eat or take the probiotics until 2 hours after your antibiotic. If you are unable to tolerate these side effects follow-up with your primary care provider or return to the emergency department.  ? ?If you begin to experience any blistering, rashes, swelling, or difficulty breathing seek medical care for evaluation of potentially more serious side effects.  ? ?Be sure to eat something when taking the Naproxen as it can cause stomach upset and at worst stomach bleeding. Do not take additional non steroidal anti-inflammatory medicines such as Ibuprofen, Aleve, Advil, Mobic, Diclofenac, or goodie powder while taking Naproxen. You may supplement with Tylenol.  ? ?Use lidocaine every 6 hours as needed for pain.  ? ?Take zofran every 8 hours as needed for nausea.  ? ?We have prescribed you new medication(s) today. Discuss the medications prescribed today with your pharmacist as they can have adverse effects and interactions with your other medicines including over the counter and prescribed medications. Seek medical evaluation if you start to experience new or abnormal symptoms after taking one of these medicines, seek care immediately if you start to experience difficulty breathing, feeling of your throat closing, facial swelling, or rash as these could be indications of a more serious allergic reaction ? ?Return to the ED for new or worsening  symptoms including but not limited to experience fever, chills, neck stiffness/pain, or inability to move your neck or open your mouth, trouble breathing, or any other concerns.  ? ?

## 2022-01-19 NOTE — ED Provider Notes (Signed)
?[facility] EMERGENCY DEPARTMENT ?Provider Note ? ? ?CSN: [id number] ?Arrival date & time: 01/19/22  0626 ? ?  ? ?History ? ?Chief Complaint  ?Patient presents with  ? Dental Pain  ? ? ?Maria Ware is a 26 y.o. female who presents to the emergency department with complaints of dental pain for the past 3 days.  Patient states she is having pain to her left lower molar region is constant, severe, and progressively worsening.  She states this pain the whole left side of her head and throat hurt.  Pain is worse when trying to eat.  No alleviating factors.  With not wanting to eat much with her tooth hurting she has felt nauseous when taking medicine at time, denies abdominal pain. Denies fever, intraoral drainage, vomiting, or shortness of breath. Denies chance of pregnancy  ? ?HPI ? ?  ? ?Home Medications ?Prior to Admission medications   ?Medication Sig Start Date End Date Taking? Authorizing Provider  ?ibuprofen (ADVIL) 600 MG tablet Take 1 tablet (600 mg total) by mouth every 6 (six) hours. ?Patient not taking: Reported on 11/26/2021 05/31/21   Rolm Bookbinder, CNM  ?nitrofurantoin, macrocrystal-monohydrate, (MACROBID) 100 MG capsule Take 1 capsule (100 mg total) by mouth 2 (two) times daily. 11/29/21   Venora Maples, MD  ?Prenatal Vit-Fe Fumarate-FA (PRENATAL VITAMINS PO) Take 1 tablet by mouth daily. ?Patient not taking: Reported on 11/26/2021    [provider]  ?   ? ?Allergies    ?Patient has no known allergies.   ? ?Review of Systems   ?Review of Systems  ?Constitutional:  Negative for chills and fever.  ?HENT:  Positive for dental problem and sore throat. Negative for ear pain.   ?Respiratory:  Negative for shortness of breath.   ?Cardiovascular:  Negative for chest pain.  ?Gastrointestinal:  Positive for nausea. Negative for abdominal pain and vomiting.  ?Neurological:  Positive for headaches.  ?All other systems reviewed and are negative. ? ?Physical  Exam ?Updated Vital Signs ?BP (!) 137/96   Pulse 74   Temp 98.6 ?F (37 ?C) (Oral)   Resp 16   Ht 5\' 6"  (1.676 m)   Wt 60.8 kg   SpO2 98%   BMI 21.63 kg/m?  ?Physical Exam ?Vitals and nursing note reviewed.  ?Constitutional:   ?   General: She is not in acute distress. ?   Appearance: She is well-developed. She is not toxic-appearing.  ?HENT:  ?   Head: Normocephalic and atraumatic.  ?   Right Ear: Ear canal normal. Tympanic membrane is not perforated, erythematous, retracted or bulging.  ?   Left Ear: Ear canal normal. Tympanic membrane is not perforated, erythematous, retracted or bulging.  ?   Ears:  ?   Comments: No mastoid erythema/swelling/tenderness.  ?   Nose: Nose normal.  ?   Right Sinus: No maxillary sinus tenderness or frontal sinus tenderness.  ?   Left Sinus: No maxillary sinus tenderness or frontal sinus tenderness.  ?   Mouth/Throat:  ?   Pharynx: Uvula midline. No oropharyngeal exudate, posterior oropharyngeal erythema or uvula swelling.  ?   Tonsils: No tonsillar abscesses.  ?   Comments: Poor dentition throughout, multiple fillings present, dental decay noted.  Left lower molar gingiva is erythematous, swollen, and tender to palpation.  There is no palpable fluctuance or gross abscess. ?Posterior oropharynx is symmetric appearing. Patient tolerating own secretions without difficulty. No trismus. No drooling. No hot potato voice. No swelling beneath  the tongue, submandibular compartment is soft.  ?Eyes:  ?   General:     ?   Right eye: No discharge.     ?   Left eye: No discharge.  ?   Conjunctiva/sclera: Conjunctivae normal.  ?   Pupils: Pupils are equal, round, and reactive to light.  ?Cardiovascular:  ?   Rate and Rhythm: Normal rate and regular rhythm.  ?   Heart sounds: No murmur heard. ?Pulmonary:  ?   Effort: Pulmonary effort is normal. No respiratory distress.  ?   Breath sounds: Normal breath sounds. No wheezing, rhonchi or rales.  ?Abdominal:  ?   General: There is no distension.  ?    Palpations: Abdomen is soft.  ?   Tenderness: There is no abdominal tenderness.  ?Musculoskeletal:  ?   Cervical back: Normal range of motion and neck supple. No edema, erythema, rigidity or crepitus. No pain with movement. Normal range of motion.  ?Lymphadenopathy:  ?   Cervical: No cervical adenopathy.  ?Skin: ?   General: Skin is warm and dry.  ?   Findings: No rash.  ?Neurological:  ?   Mental Status: She is alert.  ?   Comments: Alert.  Clear speech.  Strength and sensation grossly intact, ambulatory with steady gait.  ?Psychiatric:     ?   Behavior: Behavior normal.     ?   Thought Content: Thought content normal.  ? ? ?ED Results / Procedures / Treatments   ?Labs ?(all labs ordered are listed, but only abnormal results are displayed) ?Labs Reviewed  ?GROUP A STREP BY PCR  ? ? ?EKG ?None ? ?Radiology ?No results found. ? ?Procedures ?Procedures  ? ? ?Medications Ordered in ED ?Medications  ?ondansetron (ZOFRAN-ODT) disintegrating tablet 4 mg (has no administration in time range)  ?lidocaine (XYLOCAINE) 2 % viscous mouth solution 15 mL (has no administration in time range)  ?ketorolac (TORADOL) 15 MG/ML injection 30 mg (has no administration in time range)  ?amoxicillin-clavulanate (AUGMENTIN) 875-125 MG per tablet 1 tablet (has no administration in time range)  ?acetaminophen (TYLENOL) tablet 650 mg (650 mg Oral Given 01/19/22 0340)  ? ? ?ED Course/ Medical Decision Making/ A&P ?  ?                        ?Medical Decision Making ?Risk ?OTC drugs. ?Prescription drug management. ? ? ?Patient presents to the ED with dental pain.  ?Nontoxic, vitals w/ elevated BP- doubt HTN emergency.  ?Chart/nursing note reviewed for additional hx.  ? ?No gross abscess.  Exam unconcerning for Ludwig's angina or deep space infection.  Strep negative. Exam not consistent w/ RPA/PTA at this time. No meningismus. No trismus, no drooling, no hot potato voice, tolerating own secretions with soft mandibular space. No neuro deficits.  Given supportive care in the ED- I ordered lidocaine & toradol for pain as well as zofran for stomach upset and augmentin for abx coverage.  ? ?05:45: RE-EVAL: Patient feeling much better. Appears appropriate for discharge at this time.  ? ?Will treat with Augmentin and additional supportive medicines.  Urged patient to follow-up with dentist, dental resources were provided.  Discussed treatment plan and need for follow up as well as return precautions. Provided opportunity for questions, patient confirmed understanding and is agreeable to plan. ? ? ?Final Clinical Impression(s) / ED Diagnoses ?Final diagnoses:  ?Pain, dental  ? ? ?Rx / DC Orders ?ED Discharge Orders   ? ?  Ordered  ?  amoxicillin-clavulanate (AUGMENTIN) 875-125 MG tablet  Every 12 hours       ? 01/19/22 0546  ?  naproxen (NAPROSYN) 500 MG tablet  2 times daily PRN       ? 01/19/22 0546  ?  lidocaine (XYLOCAINE) 2 % solution  Every 6 hours PRN       ? 01/19/22 0546  ?  ondansetron (ZOFRAN-ODT) 4 MG disintegrating tablet  Every 8 hours PRN       ? 01/19/22 0546  ? ?  ?  ? ?  ? ? ?  ?Cherly Anderson, PA-C ?01/19/22 1610 ? ?  ?Marily Memos, MD ?01/19/22 9604 ? ?

## 2022-02-13 ENCOUNTER — Encounter (HOSPITAL_COMMUNITY): Payer: Self-pay

## 2022-02-13 ENCOUNTER — Ambulatory Visit (HOSPITAL_COMMUNITY)
Admission: EM | Admit: 2022-02-13 | Discharge: 2022-02-13 | Disposition: A | Discharge status: Home / Self Care | Payer: Medicaid Other | Attending: Internal Medicine | Admitting: Internal Medicine

## 2022-02-13 DIAGNOSIS — K0889 Other specified disorders of teeth and supporting structures: Secondary | ICD-10-CM

## 2022-02-13 DIAGNOSIS — K047 Periapical abscess without sinus: Secondary | ICD-10-CM

## 2022-02-13 MED ORDER — NAPROXEN 500 MG PO TABS: 500.0000 mg | ORAL_TABLET | Freq: Two times a day (BID) | ORAL | 0 refills | Status: DC | PRN

## 2022-02-13 MED ORDER — CLINDAMYCIN HCL 150 MG PO CAPS: 450.0000 mg | ORAL_CAPSULE | Freq: Three times a day (TID) | ORAL | 0 refills | Status: AC

## 2022-02-13 NOTE — ED Provider Notes (Addendum)
?[facility] ? ? ? ?CSN: [id number] ?Arrival date & time: 02/13/22  1015 ? ? ?  ? ?History   ?Chief Complaint ?Chief Complaint  ?Patient presents with  ? Dental Pain  ? ? ?HPI ?Maria Ware is a 26 y.o. female.  ? ?Patient presents with right lower and right upper dental pain that has been flaring up for the past 4 days.  Patient was seen in ED on 01/19/2022 and was prescribed Augmentin antibiotic as well as several medications to help alleviate discomfort and pain.  Patient reports that she took 3 pills of the Augmentin but stopped taking it because her pain improved.  Then, she lost the antibiotic and the other medications so was not able to complete them.  Patient found meloxicam at home that previously was prescribed for her that she took over the past few days with minimal improvement in pain.  Patient denies any fevers, body aches, chills.  She reports that she is on a waiting list for dentist but has not been able to be seen by one yet. ? ? ?Dental Pain ? ?Past Medical History:  ?Diagnosis Date  ? COVID-19 affecting pregnancy in first trimester 11/03/2020  ? 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 ? ?Patient Active Problem List  ? Diagnosis Date Noted  ? UTI (urinary tract infection) 06/25/2021  ? Indication for care in labor and delivery, antepartum 05/29/2021  ? Normal labor 05/29/2021  ? SVD (spontaneous vaginal delivery) 05/29/2021  ? COVID-19 vaccination declined 01/31/2021  ? Late prenatal care affecting pregnancy in second trimester 01/02/2021  ? Supervision of low-risk pregnancy 12/23/2020  ? COVID-19 affecting pregnancy in first trimester 11/03/2020  ? ? ?Past Surgical History:  ?Procedure Laterality Date  ? NO PAST SURGERIES    ? ? ?OB History   ? ? Gravida  ?1  ? Para  ?1  ? Term  ?1  ? Preterm  ?   ? AB  ?   ? Living  ?1  ?  ? ? SAB  ?   ? IAB  ?   ? Ectopic  ?   ? Multiple  ?0  ? Live Births  ?1  ?   ?  ?  ? ? ? ?Home Medications   ? ?Prior to Admission medications    ?Medication Sig Start Date End Date Taking? Authorizing Provider  ?clindamycin (CLEOCIN) 150 MG capsule Take 3 capsules (450 mg total) by mouth 3 (three) times daily for 5 days. 02/13/22 02/18/22 Yes Teodora Medici, FNP  ?lidocaine (XYLOCAINE) 2 % solution Use as directed 15 mLs in the mouth or throat every 6 (six) hours as needed for mouth pain. 01/19/22   Petrucelli, Samantha R, PA-C  ?naproxen (NAPROSYN) 500 MG tablet Take 1 tablet (500 mg total) by mouth 2 (two) times daily as needed for moderate pain. 02/13/22   Teodora Medici, FNP  ?ondansetron (ZOFRAN-ODT) 4 MG disintegrating tablet Take 1 tablet (4 mg total) by mouth every 8 (eight) hours as needed for nausea or vomiting. 01/19/22   Petrucelli, Glynda Jaeger, PA-C  ?Prenatal Vit-Fe Fumarate-FA (PRENATAL VITAMINS PO) Take 1 tablet by mouth daily. ?Patient not taking: Reported on 11/26/2021    [provider]  ? ? ?Family History ?Family History  ?Problem Relation Age of Onset  ? Cancer Maternal Grandmother   ? Other Brother   ? ? ?Social History ?Social History  ? ?Tobacco Use  ? Smoking status: Former  ?  Types: Cigars  ?  Quit date: 12/24/2019  ?  Years since quitting: 2.1  ? Smokeless tobacco: Never  ? Tobacco comments:  ?  Black and Mild  ?Vaping Use  ? Vaping Use: Never used  ?Substance Use Topics  ? Alcohol use: Not Currently  ?  Comment: rarely  ? Drug use: No  ? ? ? ?Allergies   ?Patient has no known allergies. ? ? ?Review of Systems ?Review of Systems ?Per HPI ? ?Physical Exam ?Triage Vital Signs ?ED Triage Vitals [02/13/22 1118]  ?Enc Vitals Group  ?   BP 125/74  ?   Pulse Rate 84  ?   Resp 18  ?   Temp 98.2 ?F (36.8 ?C)  ?   Temp Source Oral  ?   SpO2 99 %  ?   Weight   ?   Height   ?   Head Circumference   ?   Peak Flow   ?   Pain Score 2  ?   Pain Loc   ?   Pain Edu?   ?   Excl. in [place]?   ? ?No data found. ? ?Updated Vital Signs ?BP 125/74 (BP Location: Left Arm)   Pulse 84   Temp 98.2 ?F (36.8 ?C) (Oral)   Resp 18   LMP 02/09/2022   SpO2 99%    Breastfeeding No  ? ?Visual Acuity ?Right Eye Distance:   ?Left Eye Distance:   ?Bilateral Distance:   ? ?Right Eye Near:   ?Left Eye Near:    ?Bilateral Near:    ? ?Physical Exam ?Constitutional:   ?   General: She is not in acute distress. ?   Appearance: Normal appearance. She is not toxic-appearing or diaphoretic.  ?HENT:  ?   Head: Normocephalic and atraumatic.  ?   Mouth/Throat:  ?   Lips: Pink.  ?   Mouth: Mucous membranes are moist.  ?   Dentition: Abnormal dentition. Dental tenderness and gingival swelling present.  ?   Comments: Mild gingival swelling and erythema located to right upper dentition as well as right lower dentition.  No obvious abscesses noted.  No purulent drainage noted.  No tenderness present. ?Eyes:  ?   Extraocular Movements: Extraocular movements intact.  ?   Conjunctiva/sclera: Conjunctivae normal.  ?Pulmonary:  ?   Effort: Pulmonary effort is normal.  ?Neurological:  ?   General: No focal deficit present.  ?   Mental Status: She is alert and oriented to person, place, and time. Mental status is at baseline.  ?Psychiatric:     ?   Mood and Affect: Mood normal.     ?   Behavior: Behavior normal.     ?   Thought Content: Thought content normal.     ?   Judgment: Judgment normal.  ? ? ? ?UC Treatments / Results  ?Labs ?(all labs ordered are listed, but only abnormal results are displayed) ?Labs Reviewed - No data to display ? ?EKG ? ? ?Radiology ?No results found. ? ?Procedures ?Procedures (including critical care time) ? ?Medications Ordered in UC ?Medications - No data to display ? ?Initial Impression / Assessment and Plan / UC Course  ?I have reviewed the triage vital signs and the nursing notes. ? ?Pertinent labs & imaging results that were available during my care of the patient were reviewed by me and considered in my medical decision making (see chart for details). ? ?  ? ?Will treat dental infection with clindamycin as patient recently took Augmentin  a few weeks ago.  Patient  already has viscous lidocaine at home so encouraged patient to use this as needed.  Patient requesting naproxen to be refilled as she lost her previous prescription.  I do think this is reasonable and naproxen was refilled.  She was advised to not take any additional NSAIDs with this medication.  Patient denies chance of pregnancy so I do think this is safe. Patient advised to follow-up with dentist for further evaluation and management.  Discussed return precautions.  Patient verbalized understanding and was agreeable with plan. ?Final Clinical Impressions(s) / UC Diagnoses  ? ?Final diagnoses:  ?Pain, dental  ?Dental infection  ? ? ? ?Discharge Instructions   ? ?  ?You have a dental infection which is being treated with an antibiotic.  You have also been prescribed naproxen to take as needed for pain.  Please do not take any additional ibuprofen, Advil, Aleve while taking naproxen.  Follow-up with dentist for further evaluation and management. ? ? ? ?ED Prescriptions   ? ? Medication Sig Dispense Auth. Provider  ? naproxen (NAPROSYN) 500 MG tablet  (Status: Discontinued) Take 1 tablet (500 mg total) by mouth 2 (two) times daily as needed for moderate pain. 15 tablet Teodora Medici, [place]  ? clindamycin (CLEOCIN) 150 MG capsule Take 3 capsules (450 mg total) by mouth 3 (three) times daily for 5 days. 45 capsule Joy, [place] E, [place]  ? naproxen (NAPROSYN) 500 MG tablet Take 1 tablet (500 mg total) by mouth 2 (two) times daily as needed for moderate pain. 15 tablet Oswaldo Conroy E, [place]  ? ?  ? ?PDMP not reviewed this encounter. ?  ?Teodora Medici, [place] ?02/13/22 1132 ? ?  ?Teodora Medici, [place] ?02/13/22 1133 ? ?

## 2022-02-13 NOTE — Discharge Instructions (Signed)
You have a dental infection which is being treated with an antibiotic.  You have also been prescribed naproxen to take as needed for pain.  Please do not take any additional ibuprofen, Advil, Aleve while taking naproxen.  Follow-up with dentist for further evaluation and management. ?

## 2022-02-13 NOTE — ED Triage Notes (Signed)
Pt c/o rt lower toothache with swelling x4 days. States seen in ED 3 wks ago for lt lower toothache but lost her antibiotics and pain medication. States took meloxicam for past few days. ?

## 2023-04-20 ENCOUNTER — Other Ambulatory Visit: Payer: Self-pay

## 2023-04-20 ENCOUNTER — Emergency Department (HOSPITAL_COMMUNITY)
Admission: EM | Admit: 2023-04-20 | Discharge: 2023-04-20 | Disposition: A | Discharge status: Home / Self Care | Payer: Medicaid Other | Attending: Emergency Medicine | Admitting: Emergency Medicine

## 2023-04-20 DIAGNOSIS — K0889 Other specified disorders of teeth and supporting structures: Secondary | ICD-10-CM | POA: Diagnosis present

## 2023-04-20 MED ORDER — AMOXICILLIN-POT CLAVULANATE 875-125 MG PO TABS: 1.0000 | ORAL_TABLET | Freq: Two times a day (BID) | ORAL | 0 refills | Status: DC

## 2023-04-20 NOTE — ED Triage Notes (Signed)
Pt. Stated, I have bad gums for the last 2 days but tooth has been bad for 3-4 years.

## 2023-04-20 NOTE — Discharge Instructions (Addendum)
Please follow-up with the dentist I have attached you for your dentist of your choosing due to recent ER visit and symptoms.  Please pick up the antibiotics and take fully to prevent any infection.  You may use Tylenol every 6 hours as needed for pain.  If symptoms change or worsen please return to ER.

## 2023-04-20 NOTE — ED Notes (Addendum)
Pt declined urine preg. DC instructions reviewed with pt. PT declined VS.  PT verbalized understanding. Pt DC.

## 2023-04-20 NOTE — ED Provider Notes (Signed)
[place] EMERGENCY DEPARTMENT AT [facility] Provider Note   CSN: [id number] Arrival date & time: 04/20/23  1032     History  Chief Complaint  Patient presents with   Dental Problem    Maria Ware is a 27 y.o. female history of dental caries presented with right-sided mouth pain and concern for pregnancy.  Patient stated that she has been seen in the ED multiple times in the past and been given antibiotics however has been unable to follow-up with a dentist due to financial issues.  Patient states 2 days ago she began noticing pain in the right upper part of her mouth.  Patient states she is still able to eat and drink without issue and denies any facial or neck swelling.  Patient denies any fevers.  Patient notes that she has a dental carry up there and thinks this is the cause of her pain.  Patient stated they will however before she gets any kind of medicine she wants a pregnancy test as she is 6 days late on her period.  Patient denied headache, vision changes, ear pain, sore throat, nausea/vomiting  Home Medications Prior to Admission medications   Medication Sig Start Date End Date Taking? Authorizing Provider  amoxicillin-clavulanate (AUGMENTIN) 875-125 MG tablet Take 1 tablet by mouth every 12 (twelve) hours. 04/20/23  Yes Jon Kasparek, Fayrene Fearing T, PA-C  lidocaine (XYLOCAINE) 2 % solution Use as directed 15 mLs in the mouth or throat every 6 (six) hours as needed for mouth pain. 01/19/22   Petrucelli, Samantha R, PA-C  naproxen (NAPROSYN) 500 MG tablet Take 1 tablet (500 mg total) by mouth 2 (two) times daily as needed for moderate pain. 02/13/22   Gustavus Bryant, FNP  ondansetron (ZOFRAN-ODT) 4 MG disintegrating tablet Take 1 tablet (4 mg total) by mouth every 8 (eight) hours as needed for nausea or vomiting. 01/19/22   Petrucelli, Pleas Koch, PA-C  Prenatal Vit-Fe Fumarate-FA (PRENATAL VITAMINS PO) Take 1 tablet by mouth daily. Patient not taking: Reported on  11/26/2021    [provider]      Allergies    Patient has no known allergies.    Review of Systems   Review of Systems See HPI Physical Exam Updated Vital Signs BP 107/67 (BP Location: Right Arm)   Pulse 78   Temp 98.6 F (37 C) (Oral)   Resp 20   Ht 5\' 4"  (1.626 m)   LMP 03/13/2023   SpO2 100%   BMI 23.00 kg/m  Physical Exam Constitutional:      General: She is not in acute distress. HENT:     Head:     Comments: Nonswollen    Right Ear: Tympanic membrane, ear canal and external ear normal.     Left Ear: Tympanic membrane, ear canal and external ear normal.     Nose: Nose normal.     Mouth/Throat:     Lips: Pink.     Mouth: Mucous membranes are moist. No injury or angioedema.     Tongue: No lesions. Tongue does not deviate from midline.     Palate: No mass and lesions.     Pharynx: Oropharynx is clear. Uvula midline.     Tonsils: No tonsillar exudate or tonsillar abscesses.     Comments: Dental carry noted in upper right molar Teeth were palpated and nontender and appears stable No oropharynx swelling or facial swelling Oral floor is not elevated Tolerating secretions Eyes:     Extraocular Movements: Extraocular movements  intact.     Conjunctiva/sclera: Conjunctivae normal.     Pupils: Pupils are equal, round, and reactive to light.  Neck:     Comments: No neck edema Musculoskeletal:     Cervical back: Normal range of motion and neck supple.  Neurological:     Mental Status: She is alert.     ED Results / Procedures / Treatments   Labs (all labs ordered are listed, but only abnormal results are displayed) Labs Reviewed  POC URINE PREG, ED    EKG None  Radiology No results found.  Procedures Procedures    Medications Ordered in ED Medications - No data to display  ED Course/ Medical Decision Making/ A&P                             Medical Decision Making  [name] 27 y.o. presented today for dental pain.  Working DDx that I considered at this time includes, but not limited to, reversible vs irreversible pulpitis, ludwig's angina, OM, dental abscess, PTA, RPA, Orofacial space infx, peripharyngeal space infx, dental caries, necrotizing gingivitis, gingivitis, peritonitis, parapharyngeal infx.  R/o DDx: ludwig's angina, OM, dental abscess, PTA, RPA, Orofacial space infx, peripharyngeal space infx, dental caries, necrotizing gingivitis, gingivitis, peritonitis, parapharyngeal infx: These are considered less likely due to history of present illness and physical exam findings  Review of prior external notes: 02/13/2022 ED  Unique Tests and My Interpretation:  none  Discussion with Independent Historian: None  Discussion of Management of Tests: None  Risk: Medium: prescription drug management  Risk Stratification Score: None  Plan: Patient presented for dental pain. On exam patient was in no acute distress and stable vitals.  Patient is resting comfortably on her phone when I arrived.  On exam patient did have dental carry noted on her upper right molar however the rest of her dental and physical exam was unremarkable.  Patient does not show any signs of facial or neck edema or signs of airway compromise or legs angina and so CT scan does not performed today.  Patient will be given Augmentin and encouraged to follow-up with a dentist for long-term management.  Patient also stated that before she take any medications she would want a urine pregnancy test as she is 6 days late on her period.  This test was ordered and patient stable at this time.  Patient notified nurses that she was having pain in her mouth and was wondering where her pain meds were.  Pain meds were withheld at patient's request earlier as she states she would not take any medications without a urine pregnancy test and has not given urine.  Patient expressed her frustration and demanded to be discharged without pain meds or a urine  pregnancy test.  Pregnancy test was canceled and Patient will be discharged on Augmentin and encouraged to take the Augmentin for the full duration and to follow-up with a dentist for definitive management.  Patient was given return precautions. Patient stable for discharge at this time.  Patient verbalized understanding of plan.         Final Clinical Impression(s) / ED Diagnoses Final diagnoses:  Pain, dental    Rx / DC Orders ED Discharge Orders          Ordered    amoxicillin-clavulanate (AUGMENTIN) 875-125 MG tablet  Every 12 hours        04/20/23 1200  Netta Corrigan, PA-C 04/20/23 1243    Ernie Avena, MD 04/20/23 1345

## 2023-04-26 ENCOUNTER — Ambulatory Visit (INDEPENDENT_AMBULATORY_CARE_PROVIDER_SITE_OTHER): Payer: Medicaid Other | Admitting: *Deleted

## 2023-04-26 DIAGNOSIS — Z3201 Encounter for pregnancy test, result positive: Secondary | ICD-10-CM

## 2023-04-26 DIAGNOSIS — Z3200 Encounter for pregnancy test, result unknown: Secondary | ICD-10-CM

## 2023-04-26 LAB — POCT PREGNANCY, URINE: Preg Test, Ur: POSITIVE — AB

## 2023-04-26 MED ORDER — PRENATAL 27-1 MG PO TABS: 1.0000 | ORAL_TABLET | Freq: Every day | ORAL | 11 refills | Status: AC

## 2023-04-26 NOTE — Progress Notes (Signed)
Maria Ware dropped off a urine for a pregnancy test. I called and informed her it was positive.  She reports sure , normal LMP of 03/13/23. She states she has regular periods. This makes her [redacted]w[redacted]d with EDD 12/18/23. I advised she start prenatal care with provider of her choice, she wishes to go here again.  I offered her choices of prenatal care , she chooses traditional because she states centering 2 hours is too long.  I reviewed her meds. She is taking Augmentin due to dental issue. I reviewed with Dr. Para March and advised it is safe to take in pregnancy but we do recommend she notify her dentist. PNV rx sent in. She will call for appointment if front desk does  not call her. Nancy Fetter

## 2023-04-26 NOTE — Patient Instructions (Signed)
Prenatal Care Providers           [facility]  [address] [phone]  [facility]   [address]  [phone]  [facility] [place] [facility]       [address] [phone]            [facility] @ [place]     [address] [phone]          [facility] @ [place]   [address] [phone]  [facility]  [address] [phone]     [facility]  [address]   [phone]     [facility]  Phone: [phone]  [facility]  Phone: [phone]  [facility] Phone: [phone]  [facility] Phone: [phone]  [facility] Phone: [phone]  [facility] Phone: [phone]  [facility]  Phone: [phone]  

## 2023-05-19 ENCOUNTER — Telehealth: Payer: Medicaid Other | Admitting: General Practice

## 2023-05-19 DIAGNOSIS — Z3491 Encounter for supervision of normal pregnancy, unspecified, first trimester: Secondary | ICD-10-CM

## 2023-05-19 NOTE — Progress Notes (Signed)
Called patient for new OB intake appt. Patient states she cannot do afternoon appts due to childcare and will need to reschedule today's visit. Rescheduled appt for 7/25. Asked patient how she was doing and she reports intermittent pain rated at a 5 at the worst- pain is sporadic and not constant. Denies vaginal bleeding. Advised she go to MAU if pain becomes severe/constant or is associated with bleeding. Patient reports minimal nausea/vomiting- recommended she call us if needs medication or if we can assist with anything.

## 2023-05-24 ENCOUNTER — Encounter: Payer: Medicaid Other | Admitting: Obstetrics and Gynecology

## 2023-06-03 ENCOUNTER — Telehealth (INDEPENDENT_AMBULATORY_CARE_PROVIDER_SITE_OTHER): Payer: Medicaid Other

## 2023-06-03 DIAGNOSIS — Z3491 Encounter for supervision of normal pregnancy, unspecified, first trimester: Secondary | ICD-10-CM

## 2023-06-03 DIAGNOSIS — Z3689 Encounter for other specified antenatal screening: Secondary | ICD-10-CM

## 2023-06-03 DIAGNOSIS — Z3490 Encounter for supervision of normal pregnancy, unspecified, unspecified trimester: Secondary | ICD-10-CM | POA: Insufficient documentation

## 2023-06-03 MED ORDER — BLOOD PRESSURE KIT DEVI: 1.0000 | 0 refills | Status: DC | PRN

## 2023-06-03 NOTE — Patient Instructions (Signed)
Safe Medications in Pregnancy   Acne:  Benzoyl Peroxide  Salicylic Acid   Backache/Headache:  Tylenol: 2 regular strength every 4 hours OR               2 Extra strength every 6 hours   Colds/Coughs/Allergies:  Benadryl (alcohol free) 25 mg every 6 hours as needed  Breath right strips  Claritin  Cepacol throat lozenges  Chloraseptic throat spray  Cold-Eeze- up to three times per day  Cough drops, alcohol free  Flonase (by prescription only)  Guaifenesin  Mucinex  Robitussin DM (plain only, alcohol free)  Saline nasal spray/drops  Sudafed (pseudoephedrine) & Actifed * use only after [redacted] weeks gestation and if you do not have high blood pressure  Tylenol  Vicks Vaporub  Zinc lozenges  Zyrtec   Constipation:  Colace  Ducolax suppositories  Fleet enema  Glycerin suppositories  Metamucil  Milk of magnesia  Miralax  Senokot  Smooth move tea   Diarrhea:  Kaopectate  Imodium A-D   *NO pepto Bismol   Hemorrhoids:  Anusol  Anusol HC  Preparation H  Tucks   Indigestion:  Tums  Maalox  Mylanta  Zantac  Pepcid   Insomnia:  Benadryl (alcohol free) 25mg every 6 hours as needed  Tylenol PM  Unisom, no Gelcaps   Leg Cramps:  Tums  MagGel   Nausea/Vomiting:  Bonine  Dramamine  Emetrol  Ginger extract  Sea bands  Meclizine  Nausea medication to take during pregnancy:  Unisom (doxylamine succinate 25 mg tablets) Take one tablet daily at bedtime. If symptoms are not adequately controlled, the dose can be increased to a maximum recommended dose of two tablets daily (1/2 tablet in the morning, 1/2 tablet mid-afternoon and one at bedtime).  Vitamin B6 100mg tablets. Take one tablet twice a day (up to 200 mg per day).   Skin Rashes:  Aveeno products  Benadryl cream or 25mg every 6 hours as needed  Calamine Lotion  1% cortisone cream   Yeast infection:  Gyne-lotrimin 7  Monistat 7    **If taking multiple medications, please check labels to avoid  duplicating the same active ingredients  **take medication as directed on the label  ** Do not exceed 4000 mg of tylenol in 24 hours  **Do not take medications that contain aspirin or ibuprofen             Considering Waterbirth? Guide for patients at [facility] ([facility]) Why consider waterbirth? Gentle birth for babies  Less pain medicine used in labor  May allow for passive descent/less pushing  May reduce perineal tears  More mobility and instinctive maternal position changes  Increased maternal relaxation   Is waterbirth safe? What are the risks of infection, drowning or other complications? Infection:  Very low risk (3.7 % for tub vs 4.8% for bed)  7 in 8000 waterbirths with documented infection  Poorly cleaned equipment most common cause  Slightly lower group B strep transmission rate  Drowning  Maternal:  Very low risk  Related to seizures or fainting  Newborn:  Very low risk. No evidence of increased risk of respiratory problems in multiple large studies  Physiological protection from breathing under water  Avoid underwater birth if there are any fetal complications  Once baby's head is out of the water, keep it out.  Birth complication  Some reports of cord trauma, but risk decreased by bringing baby to surface gradually  No evidence of increased risk of shoulder dystocia.   Mothers can usually change positions faster in water than in a bed, possibly aiding the maneuvers to free the shoulder.   There are 2 things you MUST do to have a waterbirth with [facility]: Attend a waterbirth class at [facility]   3rd Wednesday of every month from 7-9 pm (virtual during COVID) Free Register online at [email or web address] or [email or web address] or by calling [phone] Bring us the certificate from the class to your prenatal appointment or send via MyChart Meet with a midwife at 36 weeks* to see if you can still plan a  waterbirth and to sign the consent.   *We also recommend that you schedule as many of your prenatal visits with a midwife as possible.    Helpful information: You may want to bring a bathing suit top to the hospital to wear during labor but this is optional.  All other supplies are provided by the hospital. Please arrive at the hospital with signs of active labor, and do not wait at home until late in labor. It takes 45 min- 1 hour for fetal monitoring, and check in to your room to take place, plus transport and filling of the waterbirth tub.    Things that would prevent you from having a waterbirth: Premature, <37wks  Previous cesarean birth  Presence of thick meconium-stained fluid  Multiple gestation (Twins, triplets, etc.)  Uncontrolled diabetes or gestational diabetes requiring medication  Hypertension diagnosed in pregnancy or preexisting hypertension (gestational hypertension, preeclampsia, or chronic hypertension) Fetal growth restriction (your baby measures less than 10th percentile on ultrasound) Heavy vaginal bleeding  Non-reassuring fetal heart rate  Active infection (MRSA, etc.). Group B Strep is NOT a contraindication for waterbirth.  If your labor has to be induced and induction method requires continuous monitoring of the baby's heart rate  Other risks/issues identified by your obstetrical provider   Please remember that birth is unpredictable. Under certain unforeseeable circumstances your provider may advise against giving birth in the tub. These decisions will be made on a case-by-case basis and with the safety of you and your baby as our highest priority.     

## 2023-06-03 NOTE — Progress Notes (Signed)
New OB Intake  I connected with Maria Ware  on 06/03/23 at 11:15 AM EDT by MyChart Video Visit and verified that I am speaking with the correct person using two identifiers. Nurse is located at [facility] and pt is located at home.  I discussed the limitations, risks, security and privacy concerns of performing an evaluation and management service by telephone and the availability of in person appointments. I also discussed with the patient that there may be a patient responsible charge related to this service. The patient expressed understanding and agreed to proceed.  I explained I am completing New OB Intake today. We discussed EDD of 12/18/2023, by Last Menstrual Period. Pt is G2P1001. I reviewed her allergies, medications and Medical/Surgical/OB history.    There are no problems to display for this patient.   Concerns addressed today  Delivery Plans Plans to deliver at [facility]. Discussed the nature of our practice with multiple providers including residents and students. Due to the size of the practice, the delivering provider may not be the same as those providing prenatal care.   Patient unsure if interested in water birth. Offered upcoming OB visit with CNM to discuss further.  MyChart/Babyscripts MyChart access verified. I explained pt will have some visits in office and some virtually. Babyscripts instructions given and order placed. Patient verifies receipt of registration text/e-mail. Account successfully created and app downloaded.  Blood Pressure Cuff/Weight Scale Blood pressure cuff ordered for patient to pick-up from [employer]. Explained after first prenatal appt pt will check weekly and document in Babyscripts.  Anatomy US Explained first scheduled [place] will be around 19 weeks. Anatomy US scheduled for 07/27/2023 at 9:15am.  Is patient a CenteringPregnancy candidate?  Declined Declined due to Declined to say   Is patient a Mom+Baby Combined Care  candidate?  Not a candidate   If accepted, confirm patient does not intend to move from the area for at least 12 months, then notify Mom+Baby staff  Interested in [place]? If yes, send referral and doula dot phrase.    First visit review I reviewed new OB appt with patient. Explained pt will be seen by Dr. Vergie Living at first visit. Discussed Maria Ware genetic screening with patient. [facility]or[facility] and [employer].. Routine prenatal labs is needed at new ob appointment.   Last Pap Diagnosis  Date Value Ref Range Status  01/02/2021   Final   - Negative for intraepithelial lesion or malignancy (NILM)    Maria Ware, CMA 06/03/2023  11:32 AM

## 2023-06-11 ENCOUNTER — Ambulatory Visit (INDEPENDENT_AMBULATORY_CARE_PROVIDER_SITE_OTHER): Payer: Medicaid Other | Admitting: Obstetrics and Gynecology

## 2023-06-11 ENCOUNTER — Encounter: Payer: Self-pay | Admitting: Obstetrics and Gynecology

## 2023-06-11 ENCOUNTER — Other Ambulatory Visit: Payer: Self-pay

## 2023-06-11 ENCOUNTER — Other Ambulatory Visit (HOSPITAL_COMMUNITY): Admission: RE | Admit: 2023-06-11 | Payer: Medicaid Other | Source: Ambulatory Visit

## 2023-06-11 VITALS — BP 112/69 | HR 73 | Wt 126.4 lb

## 2023-06-11 DIAGNOSIS — Z3491 Encounter for supervision of normal pregnancy, unspecified, first trimester: Secondary | ICD-10-CM

## 2023-06-11 DIAGNOSIS — O2341 Unspecified infection of urinary tract in pregnancy, first trimester: Secondary | ICD-10-CM

## 2023-06-11 DIAGNOSIS — Z3A12 12 weeks gestation of pregnancy: Secondary | ICD-10-CM | POA: Diagnosis not present

## 2023-06-11 MED ORDER — BLOOD PRESSURE MONITORING DEVI: 1.0000 | 0 refills | Status: DC

## 2023-06-11 NOTE — Progress Notes (Signed)
New OB Note  06/11/2023   [facility]inic: [facility]  Chief Complaint: New OB  Transfer of Care Patient: no  History of Present Illness: Maria Ware is a 27 y.o. G2P1001 at 12/6 weeks (EDC 2/5 [tentative], based on Patient's last menstrual period was 03/13/2023.).  Preg complicated by has Supervision of low-risk pregnancy on their problem list.   Qmonth, regular periods No s/s of morning s/s No OB s/s.  ROS: A 12-point review of systems was performed and negative, except as stated in the above HPI.  OBGYN History: As per HPI. OB History  Gravida Para Term Preterm AB Living  2 1 1     1   SAB IAB Ectopic Multiple Live Births        0 1    # Outcome Date GA Lbr Len/2nd Weight Sex Type Anes PTL Lv  2 Current           1 Term 05/29/21 [redacted]w[redacted]d 04:34 / 01:14 7 lb 7.6 oz (3.391 kg) M Vag-Spont EPI  LIV    Any issues with any prior pregnancies: no Prior children are healthy, doing well, and without any problems or issues: yes History of pap smears: Yes. Last pap smear 2022 and results were wnl   Past Medical History: Past Medical History:  Diagnosis Date   COVID-19 affecting pregnancy in first trimester 11/03/2020   History of acute PID 12/23/2020    Past Surgical History: Past Surgical History:  Procedure Laterality Date   NO PAST SURGERIES      Family History:  Family History  Problem Relation Age of Onset   Other Brother    Cancer Maternal Grandmother     Social History:  Social History   Socioeconomic History   Marital status: Single    Spouse name: Not on file   Number of children: Not on file   Years of education: Not on file   Highest education level: Not on file  Occupational History   Not on file  Tobacco Use   Smoking status: Former    Types: Cigars    Quit date: 12/24/2019    Years since quitting: 3.4   Smokeless tobacco: Never   Tobacco comments:    Black and Mild  Vaping Use   Vaping status: Never Used   Substance and Sexual Activity   Alcohol use: Not Currently    Comment: rarely   Drug use: No   Sexual activity: Yes    Birth control/protection: None  Other Topics Concern   Not on file  Social History Narrative   Not on file   Social Determinants of Health   Financial Resource Strain: Not on File (05/01/2022)   Received from [employer], [employer]    Financial Resource Strain: 0  Food Insecurity: Not on File (05/01/2022)   Received from [place], [place]   Food Insecurity    Food: 0  Transportation Needs: Not on File (05/01/2022)   Received from [employer], [employer] Needs    Transportation: 0  Physical Activity: Not on File (05/01/2022)   Received from [place], [place]   Physical Activity    Physical Activity: 0  Stress: Not on File (05/01/2022)   Received from [place], [place]   Stress    Stress: 0  Social Connections: Not on File (05/01/2022)   Received from [place], [place]   Social Connections    Social Connections and Isolation: 0  Intimate Partner Violence: Not on file  Allergy: No Known Allergies   Current Outpatient Medications: Prenatal vitamin   Physical Exam:   BP 112/69   Pulse 73   Wt 126 lb 6.4 oz (57.3 kg)   LMP 03/13/2023   BMI 21.70 kg/m  Body mass index is 21.7 kg/m. Contractions: Not present Vag. Bleeding: None. Fundal height: not applicable FHTs: 160s  General appearance: Well nourished, well developed female in no acute distress.  Neck:  Supple, normal appearance, and no thyromegaly  Cardiovascular: S1, S2 normal, no murmur, rub or gallop, regular rate and rhythm Respiratory:  Clear to auscultation bilateral. Normal respiratory effort Abdomen: gravid, nttp, positive bowel sounds and no masses, hernias; diffusely non tender to palpation, non distended Breasts: no s/s Neuro/Psych:  Normal mood and affect.  Skin:  Warm and dry.  Lymphatic:  No inguinal lymphadenopathy.   Pelvic exam:  deferred  Laboratory: none  Imaging:  none  Assessment: patient doing well  Plan: 1. Encounter for supervision of low-risk pregnancy in first trimester Offer afp next visit. Anatomy u/s already scheduled for 9/17 - Culture, OB Urine - GC/Chlamydia probe amp ([place])not at [facility] - CBC/D/Plt+RPR+Rh+ABO+RubIgG... - Hemoglobin A1c - PANORAMA PRENATAL TEST - HORIZON Basic Panel - Blood Pressure Monitoring DEVI; 1 each by Does not apply route once a week.  Dispense: 1 each; Refill: 0 - [facility] AMB BABYSCRIPTS SCHEDULE OPTIMIZATION  2. [redacted] weeks gestation of pregnancy  Problem list reviewed and updated.  Follow up in 8 weeks.  >50% of 30 min visit spent on counseling and coordination of care.     Cornelia Copa MD Attending [facility])

## 2023-06-14 ENCOUNTER — Encounter: Payer: Self-pay | Admitting: Obstetrics and Gynecology

## 2023-06-14 ENCOUNTER — Encounter: Payer: Self-pay | Admitting: *Deleted

## 2023-06-14 MED ORDER — NITROFURANTOIN MONOHYD MACRO 100 MG PO CAPS: 100.0000 mg | ORAL_CAPSULE | Freq: Two times a day (BID) | ORAL | 0 refills | Status: DC

## 2023-06-14 NOTE — Addendum Note (Signed)
Addended by: Kathee Delton on: 06/14/2023 03:43 PM   Modules accepted: Orders

## 2023-06-14 NOTE — Addendum Note (Signed)
Addended by:  Bing on: 06/14/2023 12:57 PM   Modules accepted: Orders

## 2023-06-27 ENCOUNTER — Encounter: Payer: Self-pay | Admitting: Obstetrics and Gynecology

## 2023-06-27 DIAGNOSIS — D568 Other thalassemias: Secondary | ICD-10-CM | POA: Insufficient documentation

## 2023-07-01 ENCOUNTER — Encounter: Payer: Self-pay | Admitting: Obstetrics and Gynecology

## 2023-07-27 ENCOUNTER — Ambulatory Visit: Payer: Medicaid Other | Admitting: *Deleted

## 2023-07-27 ENCOUNTER — Other Ambulatory Visit: Payer: Self-pay | Admitting: Obstetrics and Gynecology

## 2023-07-27 ENCOUNTER — Ambulatory Visit: Payer: Medicaid Other | Attending: Obstetrics and Gynecology

## 2023-07-27 VITALS — BP 111/66 | HR 88

## 2023-07-27 DIAGNOSIS — Z363 Encounter for antenatal screening for malformations: Secondary | ICD-10-CM | POA: Diagnosis present

## 2023-07-27 DIAGNOSIS — Z148 Genetic carrier of other disease: Secondary | ICD-10-CM | POA: Diagnosis not present

## 2023-07-27 DIAGNOSIS — D582 Other hemoglobinopathies: Secondary | ICD-10-CM

## 2023-07-27 DIAGNOSIS — Z3A19 19 weeks gestation of pregnancy: Secondary | ICD-10-CM | POA: Insufficient documentation

## 2023-07-27 DIAGNOSIS — Z3491 Encounter for supervision of normal pregnancy, unspecified, first trimester: Secondary | ICD-10-CM | POA: Insufficient documentation

## 2023-09-08 ENCOUNTER — Encounter: Payer: Medicaid Other | Admitting: Family Medicine

## 2023-09-15 ENCOUNTER — Other Ambulatory Visit: Payer: Self-pay

## 2023-09-15 ENCOUNTER — Ambulatory Visit (INDEPENDENT_AMBULATORY_CARE_PROVIDER_SITE_OTHER): Payer: Medicaid Other | Admitting: Obstetrics and Gynecology

## 2023-09-15 VITALS — BP 111/67 | HR 87 | Wt 146.6 lb

## 2023-09-15 DIAGNOSIS — Z3491 Encounter for supervision of normal pregnancy, unspecified, first trimester: Secondary | ICD-10-CM

## 2023-09-15 DIAGNOSIS — O2341 Unspecified infection of urinary tract in pregnancy, first trimester: Secondary | ICD-10-CM

## 2023-09-15 DIAGNOSIS — O2342 Unspecified infection of urinary tract in pregnancy, second trimester: Secondary | ICD-10-CM

## 2023-09-15 DIAGNOSIS — D568 Other thalassemias: Secondary | ICD-10-CM

## 2023-09-15 DIAGNOSIS — Z3A26 26 weeks gestation of pregnancy: Secondary | ICD-10-CM

## 2023-09-15 MED ORDER — ACCU-CHEK SOFTCLIX LANCETS MISC: 1.0000 | Freq: Four times a day (QID) | 0 refills | Status: DC

## 2023-09-15 MED ORDER — ACCU-CHEK GUIDE VI STRP: 1.0000 | ORAL_STRIP | Freq: Four times a day (QID) | 0 refills | Status: DC

## 2023-09-15 MED ORDER — ALCOHOL PADS 70 % PADS: MEDICATED_PAD | Status: DC

## 2023-09-15 MED ORDER — ACCU-CHEK GUIDE W/DEVICE KIT: 1.0000 [IU] | PACK | Freq: Four times a day (QID) | 0 refills | Status: DC

## 2023-09-15 NOTE — Patient Instructions (Signed)
Check your blood sugars four times a day for about a 5-7 days and let us know the results. You can do this anytime in the next 2-3wks Check your blood sugar first thing in the morning before you eat or drink anything. You want this number to be 95 or less Check your blood sugars 1 or 2 hours after you start eating breakfast, lunch, or dinner. You want this to be less than 120.

## 2023-09-15 NOTE — Progress Notes (Signed)
   PRENATAL VISIT NOTE  Subjective:  Maria Ware is a 27 y.o. G2P1001 at [redacted]w[redacted]d being seen today for ongoing prenatal care.  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  Patient reports no complaints.  Contractions: Not present. Vag. Bleeding: None.  Movement: Present. Denies leaking of fluid.   The following portions of the patient's history were reviewed and updated as appropriate: allergies, current medications, past family history, past medical history, past social history, past surgical history and problem list.   Objective:   Vitals:   09/15/23 1518  BP: 111/67  Pulse: 87  Weight: 146 lb 9.6 oz (66.5 kg)    Fetal Status: Fetal Heart Rate (bpm): 144 Fundal Height: 26 cm Movement: Present     General:  Alert, oriented and cooperative. Patient is in no acute distress.  Skin: Skin is warm and dry. No rash noted.   Cardiovascular: Normal heart rate noted  Respiratory: Normal respiratory effort, no problems with respiration noted  Abdomen: Soft, gravid, appropriate for gestational age.  Pain/Pressure: Absent     Pelvic: Cervical exam deferred        Extremities: Normal range of motion.  Edema: None  Mental Status: Normal mood and affect. Normal behavior. Normal judgment and thought content.   Assessment and Plan:  Pregnancy: G2P1001 at [redacted]w[redacted]d 1. Encounter for supervision of low-risk pregnancy in first trimester Babyscripts patient Declines formal GTT. Amenable to A1c and checking sugars now for about a week and then in a month. I d/w her that this isn't as studied for GDM screening - Urine Culture - CBC - HIV antibody (with reflex) - Hemoglobin A1c - RPR  2. UTI in pregnancy, antepartum, first trimester Test of cure  - Urine Culture  3. [redacted] weeks  gestation of pregnancy - Urine Culture - CBC - HIV antibody (with reflex) - Hemoglobin A1c - RPR  Preterm labor symptoms and general obstetric precautions including but not limited to vaginal bleeding, contractions, leaking of fluid and fetal movement were reviewed in detail with the patient. Please refer to After Visit Summary for other counseling recommendations.   Return in about 4 weeks (around 10/13/2023) for per baby scripts, low risk ob, md or app.  Future Appointments  Date Time Provider Department Center  10/13/2023  1:15 PM Elmira Bing, MD [facility]    Catron Bing, MD

## 2023-09-16 LAB — CBC
Hematocrit: 33 % — ABNORMAL LOW (ref 34.0–46.6)
Hemoglobin: 11.5 g/dL (ref 11.1–15.9)
MCH: 31.2 pg (ref 26.6–33.0)
MCHC: 34.8 g/dL (ref 31.5–35.7)
MCV: 89 fL (ref 79–97)
Platelets: 310 10*3/uL (ref 150–450)
RBC: 3.69 x10E6/uL — ABNORMAL LOW (ref 3.77–5.28)
RDW: 13.1 % (ref 11.7–15.4)
WBC: 14.2 10*3/uL — ABNORMAL HIGH (ref 3.4–10.8)

## 2023-09-16 LAB — HEMOGLOBIN A1C
Est. average glucose Bld gHb Est-mCnc: 100 mg/dL
Hgb A1c MFr Bld: 5.1 % (ref 4.8–5.6)

## 2023-09-16 LAB — HIV ANTIBODY (ROUTINE TESTING W REFLEX): HIV Screen 4th Generation wRfx: NONREACTIVE

## 2023-09-16 LAB — RPR: RPR Ser Ql: NONREACTIVE

## 2023-09-17 LAB — URINE CULTURE

## 2023-09-19 ENCOUNTER — Encounter: Payer: Self-pay | Admitting: Obstetrics and Gynecology

## 2023-09-30 ENCOUNTER — Other Ambulatory Visit: Payer: Medicaid Other

## 2023-09-30 ENCOUNTER — Encounter: Payer: Medicaid Other | Admitting: Obstetrics and Gynecology

## 2023-10-13 ENCOUNTER — Encounter: Payer: Medicaid Other | Admitting: Advanced Practice Midwife

## 2023-10-13 DIAGNOSIS — Z3493 Encounter for supervision of normal pregnancy, unspecified, third trimester: Secondary | ICD-10-CM

## 2023-10-13 DIAGNOSIS — O2341 Unspecified infection of urinary tract in pregnancy, first trimester: Secondary | ICD-10-CM

## 2023-11-08 ENCOUNTER — Ambulatory Visit (INDEPENDENT_AMBULATORY_CARE_PROVIDER_SITE_OTHER): Payer: Medicaid Other | Admitting: Advanced Practice Midwife

## 2023-11-08 ENCOUNTER — Encounter: Payer: Self-pay | Admitting: Advanced Practice Midwife

## 2023-11-08 ENCOUNTER — Other Ambulatory Visit: Payer: Self-pay

## 2023-11-08 VITALS — BP 109/71 | HR 102 | Wt 150.1 lb

## 2023-11-08 DIAGNOSIS — Z3491 Encounter for supervision of normal pregnancy, unspecified, first trimester: Secondary | ICD-10-CM

## 2023-11-08 DIAGNOSIS — Z3A34 34 weeks gestation of pregnancy: Secondary | ICD-10-CM

## 2023-11-08 NOTE — Progress Notes (Signed)
   PRENATAL VISIT NOTE  Subjective:  Maria Ware is a 27 y.o. G2P1001 at [redacted]w[redacted]d being seen today for ongoing prenatal care.  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  Patient reports occasional contractions.  Contractions: Irritability. Vag. Bleeding: None.  Movement: Present. Denies leaking of fluid.   The following portions of the patient's history were reviewed and updated as appropriate: allergies, current medications, past family history, past medical history, past social history, past surgical history and problem list.   Objective:   Vitals:   11/08/23 1445  BP: 109/71  Pulse: (!) 102  Weight: 150 lb 1.6 oz (68.1 kg)    Fetal Status: Fetal Heart Rate (bpm): 150 Fundal Height: 33 cm Movement: Present     General:  Alert, oriented and cooperative. Patient is in no acute distress.  Skin: Skin is warm and dry. No rash noted.   Cardiovascular: Normal heart rate noted  Respiratory: Normal respiratory effort, no problems with respiration noted  Abdomen: Soft, gravid, appropriate for gestational age.  Pain/Pressure: Present     Pelvic: Cervical exam deferred        Extremities: Normal range of motion.  Edema: None  Mental Status: Normal mood and affect. Normal behavior. Normal judgment and thought content.   Assessment and Plan:  Pregnancy: G2P1001 at [redacted]w[redacted]d 1. Encounter for supervision of low-risk pregnancy in first trimester (Primary) --Anticipatory guidance about next visits/weeks of pregnancy given.  --Declines GTT, didn't do it with first pregnancy, no risk factors. Early A1C was wnl.  Discussed options with pt including 1 hour GTT, GTT with jelly beans, and taking CBGs x 2 weeks. Pt declines all options but considering A1C repeat at next visit.    --Reviewed risks of undiagnosed GDM in pregnancy, including pregnancy loss.   2. [redacted] weeks gestation of pregnancy   Preterm labor symptoms and general obstetric precautions including but not limited to vaginal bleeding, contractions, leaking of fluid and fetal movement were reviewed in detail with the patient. Please refer to After Visit Summary for other counseling recommendations.   Return in about 2 weeks (around 11/22/2023).  No future appointments.  Sharen Counter, CNM

## 2023-11-22 ENCOUNTER — Encounter: Payer: Medicaid Other | Admitting: Obstetrics and Gynecology

## 2023-11-30 ENCOUNTER — Ambulatory Visit (INDEPENDENT_AMBULATORY_CARE_PROVIDER_SITE_OTHER): Payer: Medicaid Other | Admitting: Obstetrics and Gynecology

## 2023-11-30 ENCOUNTER — Other Ambulatory Visit: Payer: Self-pay

## 2023-11-30 ENCOUNTER — Other Ambulatory Visit: Payer: Self-pay | Admitting: Obstetrics and Gynecology

## 2023-11-30 VITALS — BP 118/75 | HR 90 | Wt 163.0 lb

## 2023-11-30 DIAGNOSIS — Z3491 Encounter for supervision of normal pregnancy, unspecified, first trimester: Secondary | ICD-10-CM

## 2023-11-30 DIAGNOSIS — O26893 Other specified pregnancy related conditions, third trimester: Secondary | ICD-10-CM | POA: Diagnosis not present

## 2023-11-30 DIAGNOSIS — R12 Heartburn: Secondary | ICD-10-CM

## 2023-11-30 DIAGNOSIS — Z3A37 37 weeks gestation of pregnancy: Secondary | ICD-10-CM

## 2023-11-30 MED ORDER — FAMOTIDINE 20 MG PO TABS: 20.0000 mg | ORAL_TABLET | Freq: Every day | ORAL | 0 refills | Status: DC

## 2023-11-30 NOTE — Progress Notes (Signed)
   PRENATAL VISIT NOTE  Subjective:  Maria Ware is a 28 y.o. G2P1001 at [redacted]w[redacted]d being seen today for ongoing prenatal care.  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  Patient reports heartburn worse at night, unresolved by otc treatment Contractions: Irritability. Vag. Bleeding: None.  Movement: Present. Denies leaking of fluid.   The following portions of the patient's history were reviewed and updated as appropriate: allergies, current medications, past family history, past medical history, past social history, past surgical history and problem list.   Objective:   Vitals:   11/30/23 1153  BP: 118/75  Pulse: 90  Weight: 163 lb (73.9 kg)    Fetal Status: Fetal Heart Rate (bpm): 154 Fundal Height: 36 cm Movement: Present  Presentation: Vertex  General:  Alert, oriented and cooperative. Patient is in no acute distress.  Skin: Skin is warm and dry. No rash noted.   Cardiovascular: Normal heart rate noted  Respiratory: Normal respiratory effort, no problems with respiration noted  Abdomen: Soft, gravid, appropriate for gestational age.  Pain/Pressure: Present     Pelvic: Cervical exam performed in the presence of a chaperone Dilation: Closed Effacement (%): Thick    Extremities: Normal range of motion.  Edema: None  Mental Status: Normal mood and affect. Normal behavior. Normal judgment and thought content.   Assessment and Plan:  Pregnancy: G2P1001 at [redacted]w[redacted]d 1. Encounter for supervision of low-risk pregnancy in first trimester (Primary) BP and FHR normal Doing well, feeling regular movement  FH appropriate Declined GTT and 2 weeks cbgs at previous visits, discussed recommendation and amenable to A1c today  - Hemoglobin A1c  2. Heartburn during  pregnancy in third trimester Rx for pepcid sent to pharmacy, discussed avoidance of eating late, avoid spicy/greasy foods - famotidine (PEPCID) 20 MG tablet; Take 1 tablet (20 mg total) by mouth at bedtime.  Dispense: 30 tablet; Refill: 0  3. [redacted] weeks gestation of pregnancy Declines 36 week swabs, discussed recommendation for peds, potential for staying extra time at hospital, she expressed understanding  Labor precautions discussed  Still thinking about her birth control options   Preterm labor symptoms and general obstetric precautions including but not limited to vaginal bleeding, contractions, leaking of fluid and fetal movement were reviewed in detail with the patient. Please refer to After Visit Summary for other counseling recommendations.   Return in about 1 week (around 12/07/2023) for OB VISIT (MD or APP).  Future Appointments  Date Time Provider Department Center  12/08/2023  2:35 PM Anyanwu, Jethro Bastos, MD [facility]    Albertine Grates, FNP

## 2023-12-01 LAB — HEMOGLOBIN A1C
Est. average glucose Bld gHb Est-mCnc: 100 mg/dL
Hgb A1c MFr Bld: 5.1 % (ref 4.8–5.6)

## 2023-12-08 ENCOUNTER — Encounter: Payer: Medicaid Other | Admitting: Obstetrics & Gynecology

## 2023-12-10 ENCOUNTER — Inpatient Hospital Stay (HOSPITAL_COMMUNITY): Payer: Medicaid Other | Admitting: Anesthesiology

## 2023-12-10 ENCOUNTER — Other Ambulatory Visit: Payer: Self-pay

## 2023-12-10 ENCOUNTER — Inpatient Hospital Stay (HOSPITAL_COMMUNITY)
Admission: AD | Admit: 2023-12-10 | Discharge: 2023-12-12 | DRG: 807 | Disposition: A | Discharge status: Home / Self Care | Payer: Medicaid Other | Attending: Obstetrics & Gynecology | Admitting: Obstetrics & Gynecology

## 2023-12-10 ENCOUNTER — Encounter (HOSPITAL_COMMUNITY): Payer: Self-pay | Admitting: Family Medicine

## 2023-12-10 DIAGNOSIS — Z833 Family history of diabetes mellitus: Secondary | ICD-10-CM

## 2023-12-10 DIAGNOSIS — O4202 Full-term premature rupture of membranes, onset of labor within 24 hours of rupture: Secondary | ICD-10-CM | POA: Diagnosis not present

## 2023-12-10 DIAGNOSIS — Z3A38 38 weeks gestation of pregnancy: Secondary | ICD-10-CM

## 2023-12-10 DIAGNOSIS — O134 Gestational [pregnancy-induced] hypertension without significant proteinuria, complicating childbirth: Secondary | ICD-10-CM | POA: Diagnosis present

## 2023-12-10 DIAGNOSIS — Z8616 Personal history of COVID-19: Secondary | ICD-10-CM | POA: Diagnosis not present

## 2023-12-10 DIAGNOSIS — Z87891 Personal history of nicotine dependence: Secondary | ICD-10-CM

## 2023-12-10 DIAGNOSIS — O4292 Full-term premature rupture of membranes, unspecified as to length of time between rupture and onset of labor: Principal | ICD-10-CM | POA: Diagnosis present

## 2023-12-10 DIAGNOSIS — O26893 Other specified pregnancy related conditions, third trimester: Secondary | ICD-10-CM | POA: Diagnosis present

## 2023-12-10 DIAGNOSIS — Z148 Genetic carrier of other disease: Secondary | ICD-10-CM | POA: Diagnosis not present

## 2023-12-10 LAB — CBC
HCT: 33.1 % — ABNORMAL LOW (ref 36.0–46.0)
Hemoglobin: 12.2 g/dL (ref 12.0–15.0)
MCH: 30.7 pg (ref 26.0–34.0)
MCHC: 36.9 g/dL — ABNORMAL HIGH (ref 30.0–36.0)
MCV: 83.4 fL (ref 80.0–100.0)
Platelets: 303 10*3/uL (ref 150–400)
RBC: 3.97 MIL/uL (ref 3.87–5.11)
RDW: 13.6 % (ref 11.5–15.5)
WBC: 12.6 10*3/uL — ABNORMAL HIGH (ref 4.0–10.5)
nRBC: 0 % (ref 0.0–0.2)

## 2023-12-10 LAB — POCT FERN TEST: POCT Fern Test: POSITIVE

## 2023-12-10 LAB — TYPE AND SCREEN
ABO/RH(D): A POS
Antibody Screen: NEGATIVE

## 2023-12-10 LAB — RPR: RPR Ser Ql: NONREACTIVE

## 2023-12-10 MED ORDER — TRANEXAMIC ACID-NACL 1000-0.7 MG/100ML-% IV SOLN
INTRAVENOUS | Status: AC
  Filled 2023-12-10: qty 100

## 2023-12-10 MED ORDER — SIMETHICONE 80 MG PO CHEW: 80.0000 mg | CHEWABLE_TABLET | ORAL | Status: DC | PRN

## 2023-12-10 MED ORDER — ONDANSETRON HCL 4 MG/2ML IJ SOLN: 4.0000 mg | Freq: Four times a day (QID) | INTRAMUSCULAR | Status: DC | PRN

## 2023-12-10 MED ORDER — LACTATED RINGERS IV SOLN: INTRAVENOUS | Status: DC

## 2023-12-10 MED ORDER — IBUPROFEN 600 MG PO TABS
600.0000 mg | ORAL_TABLET | Freq: Four times a day (QID) | ORAL | Status: DC
  Administered 2023-12-11 – 2023-12-12 (×7): 600 mg via ORAL
  Filled 2023-12-10 (×7): qty 1

## 2023-12-10 MED ORDER — PHENYLEPHRINE 80 MCG/ML (10ML) SYRINGE FOR IV PUSH (FOR BLOOD PRESSURE SUPPORT): 80.0000 ug | PREFILLED_SYRINGE | INTRAVENOUS | Status: DC | PRN

## 2023-12-10 MED ORDER — LACTATED RINGERS IV SOLN
500.0000 mL | Freq: Once | INTRAVENOUS | Status: AC
  Administered 2023-12-10: 500 mL via INTRAVENOUS

## 2023-12-10 MED ORDER — ONDANSETRON HCL 4 MG PO TABS: 4.0000 mg | ORAL_TABLET | ORAL | Status: DC | PRN

## 2023-12-10 MED ORDER — FENTANYL-BUPIVACAINE-NACL 0.5-0.125-0.9 MG/250ML-% EP SOLN
12.0000 mL/h | EPIDURAL | Status: DC | PRN
  Administered 2023-12-10: 12 mL/h via EPIDURAL
  Filled 2023-12-10: qty 250

## 2023-12-10 MED ORDER — TERBUTALINE SULFATE 1 MG/ML IJ SOLN: 0.2500 mg | Freq: Once | INTRAMUSCULAR | Status: DC | PRN

## 2023-12-10 MED ORDER — DIPHENHYDRAMINE HCL 25 MG PO CAPS: 25.0000 mg | ORAL_CAPSULE | Freq: Four times a day (QID) | ORAL | Status: DC | PRN

## 2023-12-10 MED ORDER — LACTATED RINGERS IV SOLN
500.0000 mL | INTRAVENOUS | Status: DC | PRN
Start: 2023-12-10 — End: 2023-12-10

## 2023-12-10 MED ORDER — TRANEXAMIC ACID-NACL 1000-0.7 MG/100ML-% IV SOLN
1000.0000 mg | Freq: Once | INTRAVENOUS | Status: AC
Start: 2023-12-10 — End: 2023-12-10
  Administered 2023-12-10: 1000 mg via INTRAVENOUS

## 2023-12-10 MED ORDER — LIDOCAINE-EPINEPHRINE (PF) 2 %-1:200000 IJ SOLN
INTRAMUSCULAR | Status: DC | PRN
  Administered 2023-12-10: 5 mL via EPIDURAL

## 2023-12-10 MED ORDER — ACETAMINOPHEN 325 MG PO TABS
650.0000 mg | ORAL_TABLET | ORAL | Status: DC | PRN
  Administered 2023-12-11 (×3): 650 mg via ORAL
  Filled 2023-12-10 (×3): qty 2

## 2023-12-10 MED ORDER — ZOLPIDEM TARTRATE 5 MG PO TABS: 5.0000 mg | ORAL_TABLET | Freq: Every evening | ORAL | Status: DC | PRN

## 2023-12-10 MED ORDER — ACETAMINOPHEN 325 MG PO TABS: 650.0000 mg | ORAL_TABLET | ORAL | Status: DC | PRN

## 2023-12-10 MED ORDER — EPHEDRINE 5 MG/ML INJ: 10.0000 mg | INTRAVENOUS | Status: DC | PRN

## 2023-12-10 MED ORDER — SENNOSIDES-DOCUSATE SODIUM 8.6-50 MG PO TABS
2.0000 | ORAL_TABLET | Freq: Every day | ORAL | Status: DC
  Administered 2023-12-11: 2 via ORAL
  Filled 2023-12-10 (×2): qty 2

## 2023-12-10 MED ORDER — OXYTOCIN-SODIUM CHLORIDE 30-0.9 UT/500ML-% IV SOLN
2.5000 [IU]/h | INTRAVENOUS | Status: DC
Start: 2023-12-10 — End: 2023-12-10
  Filled 2023-12-10: qty 500

## 2023-12-10 MED ORDER — OXYTOCIN-SODIUM CHLORIDE 30-0.9 UT/500ML-% IV SOLN
1.0000 m[IU]/min | INTRAVENOUS | Status: DC
  Administered 2023-12-10: 2 m[IU]/min via INTRAVENOUS

## 2023-12-10 MED ORDER — LIDOCAINE HCL (PF) 1 % IJ SOLN: 30.0000 mL | INTRAMUSCULAR | Status: DC | PRN

## 2023-12-10 MED ORDER — OXYTOCIN BOLUS FROM INFUSION
333.0000 mL | Freq: Once | INTRAVENOUS | Status: AC
  Administered 2023-12-10: 333 mL via INTRAVENOUS

## 2023-12-10 MED ORDER — ONDANSETRON HCL 4 MG/2ML IJ SOLN: 4.0000 mg | INTRAMUSCULAR | Status: DC | PRN

## 2023-12-10 MED ORDER — OXYCODONE-ACETAMINOPHEN 5-325 MG PO TABS: 2.0000 | ORAL_TABLET | ORAL | Status: DC | PRN

## 2023-12-10 MED ORDER — FENTANYL CITRATE (PF) 100 MCG/2ML IJ SOLN: 50.0000 ug | INTRAMUSCULAR | Status: DC | PRN

## 2023-12-10 MED ORDER — DIPHENHYDRAMINE HCL 50 MG/ML IJ SOLN: 12.5000 mg | INTRAMUSCULAR | Status: DC | PRN

## 2023-12-10 MED ORDER — FENTANYL CITRATE (PF) 100 MCG/2ML IJ SOLN
100.0000 ug | INTRAMUSCULAR | Status: DC | PRN
  Administered 2023-12-10: 100 ug via INTRAVENOUS
  Filled 2023-12-10: qty 2

## 2023-12-10 MED ORDER — DIBUCAINE (PERIANAL) 1 % EX OINT: 1.0000 | TOPICAL_OINTMENT | CUTANEOUS | Status: DC | PRN

## 2023-12-10 MED ORDER — PRENATAL MULTIVITAMIN CH
1.0000 | ORAL_TABLET | Freq: Every day | ORAL | Status: DC
  Administered 2023-12-11 – 2023-12-12 (×2): 1 via ORAL
  Filled 2023-12-10 (×2): qty 1

## 2023-12-10 MED ORDER — WITCH HAZEL-GLYCERIN EX PADS: 1.0000 | MEDICATED_PAD | CUTANEOUS | Status: DC | PRN

## 2023-12-10 MED ORDER — OXYCODONE-ACETAMINOPHEN 5-325 MG PO TABS: 1.0000 | ORAL_TABLET | ORAL | Status: DC | PRN

## 2023-12-10 MED ORDER — TETANUS-DIPHTH-ACELL PERTUSSIS 5-2.5-18.5 LF-MCG/0.5 IM SUSY: 0.5000 mL | PREFILLED_SYRINGE | Freq: Once | INTRAMUSCULAR | Status: DC

## 2023-12-10 MED ORDER — COCONUT OIL OIL: 1.0000 | TOPICAL_OIL | Status: DC | PRN

## 2023-12-10 MED ORDER — SOD CITRATE-CITRIC ACID 500-334 MG/5ML PO SOLN: 30.0000 mL | ORAL | Status: DC | PRN

## 2023-12-10 MED ORDER — BENZOCAINE-MENTHOL 20-0.5 % EX AERO
1.0000 | INHALATION_SPRAY | CUTANEOUS | Status: DC | PRN
  Administered 2023-12-11: 1 via TOPICAL
  Filled 2023-12-10: qty 56

## 2023-12-10 NOTE — MAU Note (Signed)

## 2023-12-10 NOTE — Progress Notes (Signed)
Maria Ware is a 28 y.o. G2P1001 at [redacted]w[redacted]d admitted for PROM  Subjective: Pt comfortable with epidural, was hurting more on right side but is becoming more comfortable. Denies rectal pressure.  S/O in room for support.   Objective: BP 118/79   Pulse 83   Temp 98.2 F (36.8 C) (Oral)   Resp 18   Ht 5\' 4"  (1.626 m)   Wt 74.4 kg   LMP 03/13/2023   SpO2 99%   BMI 28.15 kg/m  No intake/output data recorded. No intake/output data recorded.  FHT:  FHR: 135 bpm, variability: moderate,  accelerations:  Present,  decelerations:  Absent UC:   regular, every 2-3 minutes SVE:   Dilation: 5 Effacement (%): 80 Station: -1 Exam by:: Ardeen Jourdain RN  Labs: Lab Results  Component Value Date   WBC 12.6 (H) 12/10/2023   HGB 12.2 12/10/2023   HCT 33.1 (L) 12/10/2023   MCV 83.4 12/10/2023   PLT 303 12/10/2023    Assessment / Plan: Augmentation of labor, progressing well  Labor: Progressing normally Preeclampsia:   n/a Fetal Wellbeing:  Category I Pain Control:  Epidural I/D:   GBS unknown Anticipated MOD:  NSVD  Sharen Counter, CNM 12/10/2023, 12:43 PM

## 2023-12-10 NOTE — Discharge Summary (Signed)
Postpartum Discharge Summary  Date of Service updated***     Patient Name: Maria Ware DOB: July 31, 1996 MRN: [id number]  Date of admission: 12/10/2023 Delivery date:12/10/2023 Delivering provider: Sharen Counter A Date of discharge: 12/11/2023  Admitting diagnosis: Normal labor [O80, Z37.9] Intrauterine pregnancy: [redacted]w[redacted]d     Secondary diagnosis:  Active Problems:   Vaginal delivery  Additional problems: ***    Discharge diagnosis: Term Pregnancy Delivered                                              Post partum procedures:{Postpartum procedures:23558} Augmentation: Pitocin Complications: None  Hospital course: Onset of Labor With Vaginal Delivery      28 y.o. yo [id number] at [redacted]w[redacted]d was admitted in Latent Labor on 12/10/2023. Labor course was uncomplicated.  Membrane Rupture Time/Date: 3:30 AM,12/10/2023  Delivery Method:Vaginal, Spontaneous Operative Delivery:N/A Episiotomy: None Lacerations:  None Patient had a postpartum course complicated by ***.  She is ambulating, tolerating a regular diet, passing flatus, and urinating well. Patient is discharged home in stable condition on 12/11/23.  Newborn Data: Birth date:12/10/2023 Birth time:3:44 PM Gender:Female Living status:Living Apgars:9 ,9  Weight:3080 g  Magnesium Sulfate received: No BMZ received: No Rhophylac:No MMR:No T-DaP: declined Flu: No RSV Vaccine received: No Transfusion:No  Immunizations received: There is no immunization history for the selected administration types on file for this patient.  Physical exam  Vitals:   12/11/23 0009 12/11/23 0359 12/11/23 0816 12/11/23 1932  BP: 111/74 106/69 110/79 117/80  Pulse: 72 75 89 84  Resp: 18 18 18 18   Temp: 98.2 F (36.8 C) 97.9 F (36.6 C) 98.2 F (36.8 C) 98.4 F (36.9 C)  TempSrc: Oral Oral Oral Oral  SpO2: 96% 96% 97% 99%  Weight:      Height:       General: {Exam; general:21111117} Lochia: {Desc;  appropriate/inappropriate:30686::"appropriate"} Uterine Fundus: {Desc; firm/soft:30687} Incision: {Exam; incision:21111123} DVT Evaluation: {Exam; dvt:2111122} Labs: Lab Results  Component Value Date   WBC 17.1 (H) 12/11/2023   HGB 12.4 12/11/2023   HCT 33.8 (L) 12/11/2023   MCV 83.5 12/11/2023   PLT 309 12/11/2023      Latest Ref Rng & Units 12/11/2023   10:24 AM  CMP  Glucose 70 - 99 mg/dL 99   BUN 6 - 20 mg/dL 7   Creatinine 4.40 - 1.02 mg/dL 7.25   Sodium 366 - 440 mmol/L 135   Potassium 3.5 - 5.1 mmol/L 4.1   Chloride 98 - 111 mmol/L 104   CO2 22 - 32 mmol/L 20   Calcium 8.9 - 10.3 mg/dL 9.3   Total Protein 6.5 - 8.1 g/dL 7.0   Total Bilirubin 0.0 - 1.2 mg/dL 0.9   Alkaline Phos 38 - 126 U/L 147   AST 15 - 41 U/L 24   ALT 0 - 44 U/L 14    Edinburgh Score:    12/11/2023    2:18 PM  Edinburgh Postnatal Depression Scale Screening Tool  I have been able to laugh and see the funny side of things. 0  I have looked forward with enjoyment to things. 0  I have blamed myself unnecessarily when things went wrong. 0  I have been anxious or worried for no good reason. 0  I have felt scared or panicky for no good reason. 0  Things have been getting on top of  me. 0  I have been so unhappy that I have had difficulty sleeping. 0  I have felt sad or miserable. 0  I have been so unhappy that I have been crying. 0  The thought of harming myself has occurred to me. 0  Edinburgh Postnatal Depression Scale Total 0   Edinburgh Postnatal Depression Scale Total: 0   After visit meds:  Allergies as of 12/11/2023   No Known Allergies   Med Rec must be completed prior to using this [facility]*        Discharge home in stable condition Infant Feeding: {Baby feeding:23562} Infant Disposition:{CHL IP OB HOME WITH ZOXWRU:04540} Discharge instruction: per After Visit Summary and Postpartum booklet. Activity: Advance as tolerated. Pelvic rest for 6 weeks.  Diet: {OB  JWJX:[id number]} Future Appointments:No future appointments. Follow up Visit:  Message sent to [facility] on 12/10/23:  Please schedule this patient for a In person postpartum visit in 6 weeks with the following provider: APP . Additional Postpartum F/U: n/a   Low risk pregnancy complicated by:  GBS unknown Delivery mode:  Vaginal, Spontaneous Anticipated Birth Control:   Natural family planning   12/11/2023 Richardson Landry, CNM

## 2023-12-10 NOTE — Lactation Note (Signed)
This note was copied from a baby's chart. Lactation Consultation Note  Patient Name: Maria Ware Today's Date: 12/10/2023 Age:29 hours Reason for consult: Initial assessment;Early term 37-38.6wks.   MOB was given pillow support and MOB did reverse pressure soften prior to latching infant on her right breast using the football hold position, infant latched with depth, but did not elicit the suck swallow response only held nipple in his mouth. MOB breast responds well with breast stimulation, pseudo- inverted but very compressible, MOB does not need nipple shield at this time. MOB will continue to work on latching infant at the breast. LC reviewed hand expression, MOB remember hand expression with her 1st child. Infant was given 8 mls by spoon of MOB EBM. LC discussed the importance of maternal rest, balance meals and snacks, hydration. MOB was made aware of O/P services, breastfeeding support groups, community resources, and our phone # for post-discharge questions.    Current feeding plan: 1- MOB will hand express or do reverse pressure softening prior to latching infant at the breast, MOB will continue to BF infant by cues, on demand, every 2-3 hours, skin to skin. 2- MOB knows to ask for further latch assistance if needed. 3- MOB will continue to hand express and give infant back EBM if infant does not latch at the breast on day 1 of life.   Maternal Data Has patient been taught Hand Expression?: Yes Does the patient have breastfeeding experience prior to this delivery?: Yes How long did the patient breastfeed?: Per MOB, she briefly BF her 63 year old son for 3 weeks used nipple shield due to latch difficulties, she really wants to BF 2nd child.  Feeding Mother's Current Feeding Choice: Breast Milk and Formula  LATCH Score Latch: Too sleepy or reluctant, no latch achieved, no sucking elicited.  Audible Swallowing: None  Type of Nipple: Inverted  Comfort (Breast/Nipple):  Soft / non-tender  Hold (Positioning): Assistance needed to correctly position infant at breast and maintain latch.  LATCH Score: 3   Lactation Tools Discussed/Used    Interventions Interventions: Breast feeding basics reviewed;Assisted with latch;Skin to skin;Breast compression;Adjust position;Support pillows;Position options;Expressed milk;Reverse pressure;Education;DEBP;LC Services brochure  Discharge Pump: DEBP;Personal  Consult Status Consult Status: Follow-up Date: 12/11/23 Follow-up type: In-patient    Maria Ware 12/10/2023, 10:17 PM

## 2023-12-10 NOTE — Lactation Note (Signed)
This note was copied from a baby's chart. Lactation Consultation Note  Patient Name: Maria Ware Today's Date: 12/10/2023 Age:30 hours Reason for consult: L&D Initial assessment;Early term 37-38.6wks;Breastfeeding assistance;Mother's request;RN request  P2- MOB requested assistance with latching infant in L&D. Per RN, infant is alert, but does not seem interested in feeding. MOB informed LC that she wants to use a nipple shield because she did with her first child. LC explained how MOB's nipples are everted and her breasts are easily compressible, so she does not need one but we can use one if she wants to. LC requested to attempt without the shield first and MOB agreed. After a few attempts, infant latched, but would not suck. LC attempted this for about 7 minutes before MOB requested the shield. LC assisted with placing the 20 mm nipple shield. Infant latched again, but would not suck. LC attempted to stimulate infant for another 7 minutes, but at this time infant was not interested in feeding. LC reassured MOB that this is normal and okay. Infant is only an hour old and some babies need a little more time to adjust. LC encouraged MOB to try again in an hour or two. LC reviewed the first 24 hr birthday nap, feeding infant on cue 8-12x in 24 hrs, not allowing infant to go over 3 hrs without a feeding. MOB's RN was updated about feeding. Infant was placed STS with MOB to adjust.  Maternal Data Has patient been taught Hand Expression?: Yes Does the patient have breastfeeding experience prior to this delivery?: Yes How long did the patient breastfeed?: MOB reports "not long"  Feeding Mother's Current Feeding Choice: Breast Milk and Formula  LATCH Score Latch: Too sleepy or reluctant, no latch achieved, no sucking elicited.  Audible Swallowing: None  Type of Nipple: Everted at rest and after stimulation  Comfort (Breast/Nipple): Soft / non-tender  Hold (Positioning): Full assist,  staff holds infant at breast  LATCH Score: 4   Lactation Tools Discussed/Used Tools: Nipple Shields Nipple shield size: 20 (MOB request)  Interventions Interventions: Breast feeding basics reviewed;Assisted with latch;Breast compression;Adjust position;Support pillows;Position options;Education  Discharge Discharge Education: Warning signs for feeding baby  Consult Status Consult Status: Follow-up from L&D Date: 12/10/23 Follow-up type: In-patient    Dema Severin BS, IBCLC 12/10/2023, 5:02 PM

## 2023-12-10 NOTE — Anesthesia Procedure Notes (Signed)
Epidural Patient location during procedure: OB Start time: 12/10/2023 11:35 AM End time: 12/10/2023 11:45 AM  Staffing Anesthesiologist: Elmer Picker, MD Performed: anesthesiologist   Preanesthetic Checklist Completed: patient identified, IV checked, risks and benefits discussed, monitors and equipment checked, pre-op evaluation and timeout performed  Epidural Patient position: sitting Prep: DuraPrep and site prepped and draped Patient monitoring: continuous pulse ox, blood pressure, heart rate and cardiac monitor Approach: midline Location: L3-L4 Injection technique: LOR air  Needle:  Needle type: Tuohy  Needle gauge: 17 G Needle length: 9 cm Needle insertion depth: 4 cm Catheter type: closed end flexible Catheter size: 19 Gauge Catheter at skin depth: 10 cm Test dose: negative  Assessment Sensory level: T8 Events: blood not aspirated, no cerebrospinal fluid, injection not painful, no injection resistance, no paresthesia and negative IV test  Additional Notes Patient identified. Risks/Benefits/Options discussed with patient including but not limited to bleeding, infection, nerve damage, paralysis, failed block, incomplete pain control, headache, blood pressure changes, nausea, vomiting, reactions to medication both or allergic, itching and postpartum back pain. Confirmed with bedside nurse the patient's most recent platelet count. Confirmed with patient that they are not currently taking any anticoagulation, have any bleeding history or any family history of bleeding disorders. Patient expressed understanding and wished to proceed. All questions were answered. Sterile technique was used throughout the entire procedure. Please see nursing notes for vital signs. Test dose was given through epidural catheter and negative prior to continuing to dose epidural or start infusion. Warning signs of high block given to the patient including shortness of breath, tingling/numbness in  hands, complete motor block, or any concerning symptoms with instructions to call for help. Patient was given instructions on fall risk and not to get out of bed. All questions and concerns addressed with instructions to call with any issues or inadequate analgesia.  Reason for block:procedure for pain

## 2023-12-10 NOTE — Anesthesia Preprocedure Evaluation (Signed)
Anesthesia Evaluation  Patient identified by MRN, date of birth, ID band Patient awake    Reviewed: Allergy & Precautions, NPO status , Patient's Chart, lab work & pertinent test results  Airway Mallampati: II  TM Distance: >3 FB Neck ROM: Full    Dental no notable dental hx.    Pulmonary neg pulmonary ROS, former smoker   Pulmonary exam normal breath sounds clear to auscultation       Cardiovascular negative cardio ROS Normal cardiovascular exam Rhythm:Regular Rate:Normal     Neuro/Psych negative neurological ROS  negative psych ROS   GI/Hepatic negative GI ROS, Neg liver ROS,,,  Endo/Other  negative endocrine ROS    Renal/GU negative Renal ROS  negative genitourinary   Musculoskeletal negative musculoskeletal ROS (+)    Abdominal   Peds  Hematology negative hematology ROS (+)   Anesthesia Other Findings   Reproductive/Obstetrics (+) Pregnancy                             Anesthesia Physical Anesthesia Plan  ASA: 2  Anesthesia Plan: Epidural   Post-op Pain Management:    Induction:   PONV Risk Score and Plan: Treatment may vary due to age or medical condition  Airway Management Planned: Natural Airway  Additional Equipment:   Intra-op Plan:   Post-operative Plan:   Informed Consent: I have reviewed the patients History and Physical, chart, labs and discussed the procedure including the risks, benefits and alternatives for the proposed anesthesia with the patient or authorized representative who has indicated his/her understanding and acceptance.       Plan Discussed with: Anesthesiologist  Anesthesia Plan Comments: (Patient identified. Risks, benefits, options discussed with patient including but not limited to bleeding, infection, nerve damage, paralysis, failed block, incomplete pain control, headache, blood pressure changes, nausea, vomiting, reactions to medication,  itching, and post partum back pain. Confirmed with bedside nurse the patient's most recent platelet count. Confirmed with the patient that they are not taking any anticoagulation, have any bleeding history or any family history of bleeding disorders. Patient expressed understanding and wishes to proceed. All questions were answered. )       Anesthesia Quick Evaluation

## 2023-12-10 NOTE — MAU Note (Signed)
.  Maria Ware is a 28 y.o. at [redacted]w[redacted]d here in MAU reporting leaking clear fld since 0330. Having some ctxs. Reports good Fm and no VB. Was closed and thick at last sve   Onset of complaint: 0330 Pain score: 6 Vitals:   12/10/23 0502 12/10/23 0505  BP:  122/76  Pulse: 86   Resp: 18   Temp: 97.9 F (36.6 C)   SpO2: 99%      FHT: 147  Lab orders placed from triage: labor eval

## 2023-12-10 NOTE — H&P (Addendum)
Maria Ware is a 28 y.o. G2P1001 female at [redacted]w[redacted]d by LMP presenting for SROM and early labor.   Reports active fetal movement, contractions: irregular, every 5-8 minutes, vaginal bleeding: spotting pink tinged, membranes: SROM at 0330, clear fluid. Ctx started soon after Initiated prenatal care at [facility] at 11 wks.   Most recent u/s 07/27/23.   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  Prenatal History/Complications:  None Declined GTT:  Hgb A1C at 36 weeks 5.1 Declined GBS swab  Past Medical History: Past Medical History:  Diagnosis Date   COVID-19 affecting pregnancy in first trimester 11/03/2020   History of acute PID 12/23/2020    Past Surgical History: Past Surgical History:  Procedure Laterality Date   NO PAST SURGERIES      Obstetrical History: OB History     Gravida  2   Para  1   Term  1   Preterm      AB      Living  1      SAB      IAB      Ectopic      Multiple  0   Live Births  1           Social History: Social History   Socioeconomic History   Marital status: Single    Spouse name: Not on file   Number of children: Not on file   Years of education: Not on file   Highest education level: Not on file  Occupational History   Not on file  Tobacco Use   Smoking status: Former    Types: Cigars    Quit date: 12/24/2019    Years since quitting: 3.9   Smokeless tobacco: Never   Tobacco comments:    Black and Mild  Vaping Use   Vaping status: Never Used  Substance and Sexual Activity   Alcohol use: Not Currently    Comment: rarely   Drug use: No   Sexual activity: Yes    Birth control/protection: None  Other Topics Concern   Not on file  Social History Narrative   Not on file   Social Drivers of Health   Financial Resource Strain: Not on File (05/01/2022)   Received from [employer] [employer]    Financial Resource Strain: 0  Food  Insecurity: No Food Insecurity (12/10/2023)   Hunger Vital Sign    Worried About Running Out of Food in the Last Year: Never true    Ran Out of Food in the Last Year: Never true  Transportation Needs: No Transportation Needs (12/10/2023)   PRAPARE - Administrator, Civil Service (Medical): No    Lack of Transportation (Non-Medical): No  Physical Activity: Not on File (05/01/2022)   Received from [place], [place]   Physical Activity    Physical Activity: 0  Stress: Not on File (05/01/2022)   Received from [facility], [place]   Stress    Stress: 0  Social Connections: Not on File (07/24/2023)   Received from [facility]   Social Connections    Connectedness: 0    Family History: Family History  Problem Relation Age of Onset   Other Brother    Cancer Maternal Grandmother    Diabetes Paternal Grandmother    Diabetes Paternal Grandfather    Asthma Neg Hx    Heart disease Neg Hx    Hypertension Neg Hx     Allergies: No  Known Allergies  Medications Prior to Admission  Medication Sig Dispense Refill Last Dose/Taking   Prenatal 27-1 MG TABS Take 1 tablet by mouth daily. 30 tablet 11 Past Week   Accu-Chek Softclix Lancets lancets 1 each by Other route 4 (four) times daily. Use as instructed (Patient not taking: Reported on 11/30/2023) 100 each 0    Alcohol Swabs (ALCOHOL PADS) 70 % PADS Use PRN (Patient not taking: Reported on 11/30/2023)      Blood Glucose Monitoring Suppl (ACCU-CHEK GUIDE) w/Device KIT 1 Units by Does not apply route 4 (four) times daily. (Patient not taking: Reported on 11/30/2023) 1 kit 0    Blood Pressure Monitoring DEVI 1 each by Does not apply route once a week. (Patient not taking: Reported on 07/27/2023) 1 each 0    famotidine (PEPCID) 20 MG tablet TAKE 1 TABLET(20 MG) BY MOUTH AT BEDTIME 90 tablet 1    glucose blood (ACCU-CHEK GUIDE) test strip 1 each by Other route 4 (four) times daily. Use as instructed (Patient not taking: Reported on 11/08/2023) 100 each 0      Review of Systems  Pertinent pos/neg as indicated in HPI  Blood pressure 121/72, pulse 86, temperature 98.7 F (37.1 C), temperature source Oral, resp. rate 18, height 5\' 4"  (1.626 m), weight 74.4 kg, last menstrual period 03/13/2023, SpO2 97%. General appearance: alert and cooperative Lungs: clear to auscultation bilaterally Heart: regular rate and rhythm Abdomen: gravid, soft, non-tender Extremities: None edema  Fetal monitoring: FHR: 140 bpm, variability: moderate,  Accelerations: Present,  decelerations:  Absent Uterine activity: Frequency: Every 5-8 minutes   Presentation: cephalic per BS [place] in MAU   Prenatal labs: ABO, Rh: --/--/A POS (01/31 1610) Antibody: NEG (01/31 0538) Rubella: 3.21 (08/02 0953) RPR: Non Reactive (11/06 1538)  HBsAg: Negative (08/02 0953)  HIV: Non Reactive (11/06 1538)  GBS:   Unknown, patient declined 2hr GTT: Declined  Prenatal Transfer Tool  Maternal Diabetes: unknown Genetic Screening: Normal Maternal Ultrasounds/Referrals: Normal Fetal Ultrasounds or other Referrals:  None Maternal Substance Abuse:  No Significant Maternal Medications:  None Significant Maternal Lab Results: declined GBS swab  Results for orders placed or performed during the hospital encounter of 12/10/23 (from the past 24 hours)  CBC   Collection Time: 12/10/23  5:38 AM  Result Value Ref Range   WBC 12.6 (H) 4.0 - 10.5 K/uL   RBC 3.97 3.87 - 5.11 MIL/uL   Hemoglobin 12.2 12.0 - 15.0 g/dL   HCT 96.0 (L) 45.4 - 09.8 %   MCV 83.4 80.0 - 100.0 fL   MCH 30.7 26.0 - 34.0 pg   MCHC 36.9 (H) 30.0 - 36.0 g/dL   RDW 11.9 14.7 - 82.9 %   Platelets 303 150 - 400 K/uL   nRBC 0.0 0.0 - 0.2 %  Type and screen [facility]   Collection Time: 12/10/23  5:38 AM  Result Value Ref Range   ABO/RH(D) A POS    Antibody Screen NEG    Sample Expiration      12/13/2023,2359 Performed at [facility], [address] [place], [place] [address]   POCT  fern test   Collection Time: 12/10/23  5:48 AM  Result Value Ref Range   POCT Fern Test Positive = ruptured amniotic membanes      Assessment:  [redacted]w[redacted]d SIUP  G2P1001  SROM   Cat 1 FHR  GBS  Unknown, patient declined; pt aware of risk of GBS sepsis, longer hospital stay and  current ACOG recommendations regarding GBS:  still declines  Plan:  Admit to L&D  IV pain meds/epidural prn active labor  Expectant management of labor, SVE deferred ; if no SOL by 11am, pt wishes to discuss IOL  Anticipate SVD   Plans to Breast and Bottle feed  Contraception: None  Circumcision: Yes  Herminio Commons CNM 12/10/2023, 6:46 AM   I personally saw and evaluated the patient, performing the key elements of the service. I developed and verified the management plan that is described in the resident's/student's note, and I agree with the content with my edits above. VSS, HRR&R, Resp unlabored, Legs neg.  Cathie Beams, CNM 12/10/2023 7:53 AM

## 2023-12-10 NOTE — Progress Notes (Addendum)
Maria Ware is a 28 y.o. G2P1001 at [redacted]w[redacted]d admitted for rupture of membranes  Subjective: Pt comfortable, sitting on ball in room reporting mild/moderate contractions every 15 minutes.  S/O in room for support.   Objective: BP 117/69   Pulse (!) 105   Temp 98.7 F (37.1 C) (Oral)   Resp 18   Ht 5\' 4"  (1.626 m)   Wt 74.4 kg   LMP 03/13/2023   SpO2 97%   BMI 28.15 kg/m  No intake/output data recorded. No intake/output data recorded.  FHT:  FHR: 135 bpm, variability: moderate,  accelerations:  Present,  decelerations:  Absent.  UC:   regular, every 15 minutes SVE:   Dilation: 3 Effacement (%): 50 Station: -1 Exam by:: [employer]: Lab Results  Component Value Date   WBC 12.6 (H) 12/10/2023   HGB 12.2 12/10/2023   HCT 33.1 (L) 12/10/2023   MCV 83.4 12/10/2023   PLT 303 12/10/2023    Assessment / Plan: G2P1001 at [redacted]w[redacted]d  PROM without onset of active labor  Labor:  Discussed options with pt including continued expectant management, membrane sweep vs rupture of forebag palpated on current exam, vs starting Pitocin.  Pt opts to start Pitocin at this time.   Preeclampsia:   n/a Fetal Wellbeing:  Category I Pain Control:  Labor support without medications I/D:   GBS unknown, declined testing Anticipated MOD:  NSVD  Sharen Counter, CNM 12/10/2023, 9:49 AM

## 2023-12-11 LAB — COMPREHENSIVE METABOLIC PANEL
ALT: 14 U/L (ref 0–44)
AST: 24 U/L (ref 15–41)
Albumin: 3 g/dL — ABNORMAL LOW (ref 3.5–5.0)
Alkaline Phosphatase: 147 U/L — ABNORMAL HIGH (ref 38–126)
Anion gap: 11 (ref 5–15)
BUN: 7 mg/dL (ref 6–20)
CO2: 20 mmol/L — ABNORMAL LOW (ref 22–32)
Calcium: 9.3 mg/dL (ref 8.9–10.3)
Chloride: 104 mmol/L (ref 98–111)
Creatinine, Ser: 0.69 mg/dL (ref 0.44–1.00)
GFR, Estimated: >60 mL/min (ref >60)
Glucose, Bld: 99 mg/dL (ref 70–99)
Potassium: 4.1 mmol/L (ref 3.5–5.1)
Sodium: 135 mmol/L (ref 135–145)
Total Bilirubin: 0.9 mg/dL (ref 0.0–1.2)
Total Protein: 7 g/dL (ref 6.5–8.1)

## 2023-12-11 LAB — CBC
HCT: 33.8 % — ABNORMAL LOW (ref 36.0–46.0)
Hemoglobin: 12.4 g/dL (ref 12.0–15.0)
MCH: 30.6 pg (ref 26.0–34.0)
MCHC: 36.7 g/dL — ABNORMAL HIGH (ref 30.0–36.0)
MCV: 83.5 fL (ref 80.0–100.0)
Platelets: 309 10*3/uL (ref 150–400)
RBC: 4.05 MIL/uL (ref 3.87–5.11)
RDW: 13.6 % (ref 11.5–15.5)
WBC: 17.1 10*3/uL — ABNORMAL HIGH (ref 4.0–10.5)
nRBC: 0 % (ref 0.0–0.2)

## 2023-12-11 LAB — GLUCOSE, CAPILLARY: Glucose-Capillary: 103 mg/dL — ABNORMAL HIGH (ref 70–99)

## 2023-12-11 LAB — GLUCOSE, RANDOM: Glucose, Bld: 86 mg/dL (ref 70–99)

## 2023-12-11 MED ORDER — FUROSEMIDE 20 MG PO TABS
20.0000 mg | ORAL_TABLET | Freq: Every day | ORAL | Status: DC
  Filled 2023-12-11 (×2): qty 1

## 2023-12-11 NOTE — Plan of Care (Signed)
   Problem: Education: Goal: Knowledge of General Education information will improve Description: Including pain rating scale, medication(s)/side effects and non-pharmacologic comfort measures Outcome: Completed/Met

## 2023-12-11 NOTE — Anesthesia Postprocedure Evaluation (Signed)
Anesthesia Post Note  Patient: Maria Ware  Procedure(s) Performed: AN AD HOC LABOR EPIDURAL     Patient location during evaluation: Mother Baby Anesthesia Type: Epidural Level of consciousness: awake and alert Pain management: pain level controlled Vital Signs Assessment: post-procedure vital signs reviewed and stable Respiratory status: spontaneous breathing, nonlabored ventilation and respiratory function stable Cardiovascular status: stable Postop Assessment: no headache, no backache, epidural receding and able to ambulate Anesthetic complications: no   No notable events documented.  Last Vitals:  Vitals:   12/11/23 0359 12/11/23 0816  BP: 106/69 110/79  Pulse: 75 89  Resp: 18 18  Temp: 36.6 C 36.8 C  SpO2: 96% 97%    Last Pain:  Vitals:   12/11/23 0953  TempSrc:   PainSc: 0-No pain   Pain Goal: Patients Stated Pain Goal: 0 (12/10/23 0508)                 Athira Janowicz

## 2023-12-11 NOTE — Progress Notes (Signed)
POSTPARTUM PROGRESS NOTE  Post Partum Day 1  Subjective:  Maria Ware is a 28 y.o. [id number] s/p SVD at [redacted]w[redacted]d.  She reports she is doing well. No acute events overnight. She denies any problems with ambulating, voiding or po intake. Denies nausea or vomiting.  Pain is well controlled.  Lochia is adequate.  Objective: Blood pressure 110/79, pulse 89, temperature 98.2 F (36.8 C), temperature source Oral, resp. rate 18, height 5\' 4"  (1.626 m), weight 74.4 kg, last menstrual period 03/13/2023, SpO2 97%, unknown if currently breastfeeding.  Physical Exam:  General: alert, cooperative and no distress Chest: no respiratory distress Heart:regular rate, distal pulses intact Uterine Fundus: firm, appropriately tender DVT Evaluation: No calf swelling or tenderness Extremities: trace edema Skin: warm, dry  Recent Labs    12/10/23 0538  HGB 12.2  HCT 33.1*    Assessment/Plan: Maria Ware is a 28 y.o. [id number] s/p SVD at [redacted]w[redacted]d   PPD#1 - Doing well  Routine postpartum care PP gHTN- pt had severe range and two moderate range pressures following delivery, normotensive since  F/u CMP and CBC   Start Lasix, f/u K on CMP as above   M2B and Babyscripts  Contraception: none Feeding: both Dispo: Plan for discharge d/c tomorrow.   LOS: 1 day   Hessie Dibble, MD OB Fellow  12/11/2023, 9:58 AM

## 2023-12-11 NOTE — Progress Notes (Signed)
Rn initiated babyscripts.

## 2023-12-12 MED ORDER — IBUPROFEN 600 MG PO TABS: 600.0000 mg | ORAL_TABLET | Freq: Four times a day (QID) | ORAL | 0 refills | Status: AC

## 2023-12-12 MED ORDER — ACETAMINOPHEN 325 MG PO TABS: 650.0000 mg | ORAL_TABLET | ORAL | 0 refills | Status: AC | PRN

## 2023-12-12 MED ORDER — FUROSEMIDE 20 MG PO TABS: 20.0000 mg | ORAL_TABLET | Freq: Every day | ORAL | 0 refills | Status: AC

## 2023-12-12 NOTE — Lactation Note (Signed)
This note was copied from a baby's chart. Lactation Consultation Note  Patient Name: Maria Ware Today's Date: 12/12/2023 Age:29 hours Reason for consult: Follow-up assessment;Early term 37-38.6wks  P2, Baby has been primarily formula fed and has latched with a #20NS nipple shield once.   Mother states she feels she has received the help she needs but she has not made the initiative to breastfeed her baby and finds it easier to offer a bottle. Offered assistance with latching and mother declined at this time. Mother has OP Lactation information and will call if needed.    Maternal Data Has patient been taught Hand Expression?: Yes Does the patient have breastfeeding experience prior to this delivery?: Yes  Feeding Mother's Current Feeding Choice: Breast Milk and Formula Nipple Type: Slow - flow  Lactation Tools Discussed/Used Nipple shield size: 20  Interventions Interventions: Education  Discharge Discharge Education: Engorgement and breast care;Warning signs for feeding baby Pump: Personal;DEBP  Consult Status Consult Status: Complete Date: 12/12/23    [name] [facility] 12/12/2023, 11:36 AM

## 2023-12-20 ENCOUNTER — Telehealth (HOSPITAL_COMMUNITY): Payer: Self-pay | Admitting: *Deleted

## 2023-12-20 NOTE — Telephone Encounter (Signed)
 12/20/2023  Name: Maria Ware MRN: [id number] DOB: 1996/09/30  Reason for Call:  Transition of Care Hospital Discharge Call  Contact Status: Patient Contact Status: Message  Language assistant needed:          Follow-Up Questions:    Dimple Francis Postnatal Depression Scale:  In the Past 7 Days:    PHQ2-9 Depression Scale:     Discharge Follow-up:    Post-discharge interventions: NA  Pearlie Bougie, RN 12/20/2023 14:49

## 2024-01-19 ENCOUNTER — Ambulatory Visit: Payer: Medicaid Other | Admitting: Family Medicine
